# Patient Record
Sex: Female | Born: 1975 | Race: White | Hispanic: No | Marital: Married | Smoking: Never smoker
Health system: Southern US, Community
[De-identification: ages and names within clinical notes are randomized; demographics above are authoritative.]

## PROBLEM LIST (undated history)

## (undated) DIAGNOSIS — M25473 Effusion, unspecified ankle: Secondary | ICD-10-CM

## (undated) DIAGNOSIS — IMO0001 Reserved for inherently not codable concepts without codable children: Secondary | ICD-10-CM

## (undated) DIAGNOSIS — I10 Essential (primary) hypertension: Secondary | ICD-10-CM

## (undated) DIAGNOSIS — R609 Edema, unspecified: Secondary | ICD-10-CM

## (undated) DIAGNOSIS — M069 Rheumatoid arthritis, unspecified: Secondary | ICD-10-CM

## (undated) HISTORY — PX: MANDIBLE FRACTURE SURGERY: SHX706

## (undated) HISTORY — DX: Reserved for inherently not codable concepts without codable children: IMO0001

## (undated) HISTORY — DX: Edema, unspecified: R60.9

## (undated) HISTORY — DX: Effusion, unspecified ankle: M25.473

## (undated) HISTORY — DX: Rheumatoid arthritis, unspecified: M06.9

## (undated) HISTORY — DX: Essential (primary) hypertension: I10

---

## 1999-06-30 ENCOUNTER — Other Ambulatory Visit: Admission: RE | Admit: 1999-06-30 | Discharge: 1999-06-30 | Payer: Self-pay | Admitting: Gynecology

## 2000-06-29 ENCOUNTER — Other Ambulatory Visit: Admission: RE | Admit: 2000-06-29 | Discharge: 2000-06-29 | Payer: Self-pay | Admitting: Gynecology

## 2001-07-12 ENCOUNTER — Other Ambulatory Visit: Admission: RE | Admit: 2001-07-12 | Discharge: 2001-07-12 | Payer: Self-pay | Admitting: Gynecology

## 2005-01-20 ENCOUNTER — Ambulatory Visit: Payer: Self-pay | Admitting: Family Medicine

## 2005-02-17 ENCOUNTER — Ambulatory Visit: Payer: Self-pay | Admitting: Family Medicine

## 2006-02-08 ENCOUNTER — Ambulatory Visit: Payer: Self-pay | Admitting: Family Medicine

## 2006-07-28 ENCOUNTER — Ambulatory Visit: Payer: Self-pay | Admitting: Family Medicine

## 2006-07-28 DIAGNOSIS — I10 Essential (primary) hypertension: Secondary | ICD-10-CM | POA: Insufficient documentation

## 2006-07-29 ENCOUNTER — Encounter: Payer: Self-pay | Admitting: Family Medicine

## 2006-07-29 LAB — CONVERTED CEMR LAB
ALT: 12 units/L (ref 0–35)
AST: 13 units/L (ref 0–37)
Albumin: 4.4 g/dL (ref 3.5–5.2)
Alkaline Phosphatase: 77 units/L (ref 39–117)
BUN: 13 mg/dL (ref 6–23)
Chloride: 106 meq/L (ref 96–112)
Creatinine, Ser: 0.78 mg/dL (ref 0.40–1.20)
HDL: 57 mg/dL (ref >39)
LDL Cholesterol: 47 mg/dL (ref 0–99)
Potassium: 4.2 meq/L (ref 3.5–5.3)
TSH: 2.523 microintl units/mL (ref 0.350–5.50)
Total CHOL/HDL Ratio: 2.2

## 2006-07-30 ENCOUNTER — Encounter: Payer: Self-pay | Admitting: Family Medicine

## 2006-09-24 ENCOUNTER — Ambulatory Visit: Payer: Self-pay | Admitting: Family Medicine

## 2006-11-05 ENCOUNTER — Encounter: Payer: Self-pay | Admitting: Family Medicine

## 2006-11-18 ENCOUNTER — Ambulatory Visit: Payer: Self-pay | Admitting: Family Medicine

## 2007-02-22 ENCOUNTER — Encounter: Payer: Self-pay | Admitting: Family Medicine

## 2007-03-09 ENCOUNTER — Encounter: Payer: Self-pay | Admitting: Family Medicine

## 2007-03-09 DIAGNOSIS — IMO0001 Reserved for inherently not codable concepts without codable children: Secondary | ICD-10-CM

## 2007-03-09 HISTORY — DX: Reserved for inherently not codable concepts without codable children: IMO0001

## 2007-03-15 ENCOUNTER — Ambulatory Visit: Payer: Self-pay | Admitting: Family Medicine

## 2007-08-08 ENCOUNTER — Encounter: Payer: Self-pay | Admitting: Family Medicine

## 2007-09-19 ENCOUNTER — Encounter: Payer: Self-pay | Admitting: Family Medicine

## 2007-11-01 ENCOUNTER — Ambulatory Visit: Payer: Self-pay | Admitting: Family Medicine

## 2008-03-28 ENCOUNTER — Encounter: Payer: Self-pay | Admitting: Family Medicine

## 2008-07-05 ENCOUNTER — Ambulatory Visit: Payer: Self-pay | Admitting: Family Medicine

## 2008-07-05 DIAGNOSIS — M25569 Pain in unspecified knee: Secondary | ICD-10-CM

## 2008-07-06 ENCOUNTER — Encounter: Admission: RE | Admit: 2008-07-06 | Discharge: 2008-07-06 | Payer: Self-pay | Admitting: Family Medicine

## 2008-08-06 ENCOUNTER — Ambulatory Visit: Payer: Self-pay | Admitting: Family Medicine

## 2008-09-20 ENCOUNTER — Encounter: Payer: Self-pay | Admitting: Family Medicine

## 2009-01-08 ENCOUNTER — Ambulatory Visit: Payer: Self-pay | Admitting: Family Medicine

## 2009-01-08 ENCOUNTER — Encounter: Admission: RE | Admit: 2009-01-08 | Discharge: 2009-01-08 | Payer: Self-pay | Admitting: Family Medicine

## 2009-01-08 DIAGNOSIS — M2550 Pain in unspecified joint: Secondary | ICD-10-CM

## 2009-01-09 ENCOUNTER — Encounter: Payer: Self-pay | Admitting: Family Medicine

## 2009-01-09 LAB — CONVERTED CEMR LAB
ALT: 15 units/L (ref 0–35)
AST: 17 units/L (ref 0–37)
Alkaline Phosphatase: 69 units/L (ref 39–117)
Basophils Absolute: 0.1 10*3/uL (ref 0.0–0.1)
CRP: 0 mg/dL (ref <0.6)
Glucose, Bld: 83 mg/dL (ref 70–99)
Lymphocytes Relative: 25 % (ref 12–46)
Lymphs Abs: 2.9 10*3/uL (ref 0.7–4.0)
Neutro Abs: 8 10*3/uL — ABNORMAL HIGH (ref 1.7–7.7)
Platelets: 338 10*3/uL (ref 150–400)
RDW: 13.1 % (ref 11.5–15.5)
Sed Rate: 1 mm/hr (ref 0–22)
Sodium: 140 meq/L (ref 135–145)
Total Bilirubin: 0.5 mg/dL (ref 0.3–1.2)
Total Protein: 8.1 g/dL (ref 6.0–8.3)
WBC: 11.8 10*3/uL — ABNORMAL HIGH (ref 4.0–10.5)

## 2009-02-04 ENCOUNTER — Encounter: Payer: Self-pay | Admitting: Family Medicine

## 2009-03-01 ENCOUNTER — Telehealth (INDEPENDENT_AMBULATORY_CARE_PROVIDER_SITE_OTHER): Payer: Self-pay | Admitting: *Deleted

## 2009-03-01 ENCOUNTER — Ambulatory Visit: Payer: Self-pay | Admitting: Family Medicine

## 2009-03-01 DIAGNOSIS — J069 Acute upper respiratory infection, unspecified: Secondary | ICD-10-CM | POA: Insufficient documentation

## 2009-03-01 LAB — CONVERTED CEMR LAB
Inflenza A Ag: NEGATIVE
Influenza B Ag: NEGATIVE
Rapid Strep: NEGATIVE

## 2009-03-21 ENCOUNTER — Encounter: Payer: Self-pay | Admitting: Family Medicine

## 2009-07-01 ENCOUNTER — Ambulatory Visit: Payer: Self-pay | Admitting: Family Medicine

## 2009-07-01 DIAGNOSIS — R599 Enlarged lymph nodes, unspecified: Secondary | ICD-10-CM | POA: Insufficient documentation

## 2009-07-02 LAB — CONVERTED CEMR LAB
Basophils Absolute: 0.1 10*3/uL (ref 0.0–0.1)
Basophils Relative: 1 % (ref 0–1)
MCHC: 33 g/dL (ref 30.0–36.0)
Monocytes Relative: 6 % (ref 3–12)
Neutro Abs: 6.3 10*3/uL (ref 1.7–7.7)
Neutrophils Relative %: 67 % (ref 43–77)
RBC: 4.54 M/uL (ref 3.87–5.11)

## 2009-07-15 ENCOUNTER — Ambulatory Visit: Payer: Self-pay | Admitting: Family Medicine

## 2009-09-24 ENCOUNTER — Encounter: Payer: Self-pay | Admitting: Family Medicine

## 2010-02-18 NOTE — Progress Notes (Signed)
Summary: wants ov today  Phone Note Call from Patient Call back at Home Phone [phone]   Caller: Patient----live call Summary of Call: Wants ov today. Has a sore throat and thinks that it is strep. Please advise pt. Initial call taken by: Warnell Forester,  March 01, 2009 9:16 AM  Follow-up for Phone Call        Apt scheduled Follow-up by: Payton Spark CMA,  March 01, 2009 9:31 AM

## 2010-02-18 NOTE — Letter (Signed)
Summary: [facility]  [facility]   Imported By: Lanelle Bal 10/10/2009 11:01:31  _____________________________________________________________________  External Attachment:    Type:   Image     Comment:   External Document

## 2010-02-18 NOTE — Assessment & Plan Note (Signed)
Summary: recheck LN   Vital Signs:  Patient profile:   35 year old female Height:      66 inches Weight:      114 pounds BMI:     18.47 O2 Sat:      100 % on Room air Pulse rate:   73 / minute BP sitting:   119 / 84  (left arm) Cuff size:   regular  Vitals Entered By: Payton Spark CMA (July 15, 2009 1:07 PM)  O2 Flow:  Room air CC: F/U lump under R arm.    Primary Care Provider:  Nani Gasser MD  CC:  F/U lump under R arm. [name]  History of Present Illness: 35 yo WF presents for a painful lump under her R arm.  She has seen 2 wks ago and took 10 days of Clindamycin.  Her CBC came back normal.  The lump has reduced in size and is no longer painful.  Denies any fevers, drainage or redness.  No other complaints.  Current Medications (verified): 1)  Maxalt 10 Mg Tabs (Rizatriptan Benzoate) .... Take One Tablet By Mouth As Needed For Migraine 2)  Topamax 50 Mg Tabs (Topiramate) .[name].. 1 By Mouth Bid 3)  Methotrexate 2.5 Mg Tabs (Methotrexate Sodium) .... Take 5 Tabs Weekly  Allergies (verified): No Known Drug Allergies  Past History:  Past Medical History: Reviewed history from 03/15/2007 and no changes required. 03-09-07 IUD place.   Social History: Reviewed history from 10/27/2005 and no changes required. 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  Married to [employer] and has one daughter.  Never smoked, 1 EtOH per week, no druts, 1 caffeinated drink per week.  Walks 2x/wk  Review of Systems      See HPI  Physical Exam  General:  alert, well-developed, well-nourished, and well-hydrated.   Head:  normocephalic and atraumatic.   Mouth:  pharynx pink and moist.   Neck:  no masses.   Lungs:  Normal respiratory effort, chest expands symmetrically. Lungs are clear to auscultation, no crackles or wheezes. Heart:  Normal rate and regular rhythm. S1 and S2 normal without gallop, murmur, click, rub or other extra sounds. Skin:  color normal and no suspicious  lesions.   Axillary Nodes:  slightly shotty nontender R axillary node w/o any overlying skin changes.  freely moveable   Impression & Recommendations:  Problem # 1:  LYMPHADENOPATHY (ICD-785.6) R axillary Lymphadenitis vs folliculitis.  REsolved after 10 days of Clindamycin. Call if any problems. The following medications were removed from the medication list:    Clindamycin Hcl 300 Mg Caps (Clindamycin hcl) .[name]... 1 tab by mouth three times a day x 10 days  Complete Medication List: 1)  Maxalt 10 Mg Tabs (Rizatriptan benzoate) .... Take one tablet by mouth as needed for migraine 2)  Topamax 50 Mg Tabs (Topiramate) .[name].. 1 by mouth bid 3)  Methotrexate 2.5 Mg Tabs (Methotrexate sodium) .... Take 5 tabs weekly  Patient Instructions: 1)  Folliculitis : much improved. 2)  Use Dial soap in the shower daily. 3)  Change razor every 1 wk. 4)  Call if any further problems.

## 2010-02-18 NOTE — Assessment & Plan Note (Signed)
Summary: URI   Vital Signs:  Patient profile:   35 year old female Height:      66 inches Weight:      112 pounds BMI:     18.14 O2 Sat:      100 % on Room air Temp:     98.8 degrees F oral Pulse rate:   101 / minute BP sitting:   133 / 96  (right arm) Cuff size:   regular  Vitals Entered By: Payton Spark CMA/April (March 01, 2009 3:29 PM)  O2 Flow:  Room air CC: c/o of sore throat, cough and chills x 2 days   Primary Care Provider:  Linford Arnold, C  CC:  c/o of sore throat and cough and chills x 2 days.  History of Present Illness:  35 yo WF presents for 2 days of sore thoat, chills, cough.  no runny nose.  No fevers.  Did not get a flu shot.  She had a HA today.  No bodyaches.  Feels tired.  She is not taking anything for her symptoms.  No  missed work.   She is a Runner, broadcasting/film/video.  Sleeping fine at night.      Current Medications (verified): 1)  Maxalt 10 Mg Tabs (Rizatriptan Benzoate) .... Take One Tablet By Mouth As Needed For Migraine 2)  Topamax 50 Mg Tabs (Topiramate) .[name].. 1 By Mouth Bid  Allergies (verified): No Known Drug Allergies  Past History:  Past Medical History: Reviewed history from 03/15/2007 and no changes required. 03-09-07 IUD place.   Review of Systems      See HPI  Physical Exam  General:  alert, well-developed, well-nourished, and well-hydrated.   Head:  normocephalic and atraumatic.  sinuses NTTP Eyes:  conjunctiva clear Ears:  EACs patent; TMs translucent and gray with good cone of light and bony landmarks.  Nose:  mild clear rhinorrhea Mouth:  throat mildly injected.   Neck:  no masses.   Lungs:  Normal respiratory effort, chest expands symmetrically. Lungs are clear to auscultation, no crackles or wheezes. Heart:  Normal rate and regular rhythm. S1 and S2 normal without gallop, murmur, click, rub or other extra sounds. Abdomen:  soft, non-tender, normal bowel sounds, and no distention.   Skin:  color normal.   Cervical Nodes:  shotty  anterior cervical chain LA   Impression & Recommendations:  Problem # 1:  VIRAL URI (ICD-465.9)  Rapid strep and rapid flu neg.   Supportive care for viral URI. Call if not improved in 7 days.    Orders: Flu A+B (87400) Rapid Strep (95621)  Problem # 2:  HYPERTENSION, BENIGN ESSENTIAL (ICD-401.1) BP high today.  Limit use of systemic decongestants to 4-5 days of worst nasal congestion. Will need f/u with Dr Linford Arnold if remaining high.   BP today: 133/96 Prior BP: 131/90 (01/08/2009)  Prior 10 Yr Risk Heart Disease: 1 % (09/24/2006)  Labs Reviewed: K+: 4.3 (01/09/2009) Creat: : 0.83 (01/09/2009)   Chol: 127 (07/29/2006)   HDL: 57 (07/29/2006)   LDL: 47 (07/29/2006)   TG: 116 (07/29/2006)  Complete Medication List: 1)  Maxalt 10 Mg Tabs (Rizatriptan benzoate) .... Take one tablet by mouth as needed for migraine 2)  Topamax 50 Mg Tabs (Topiramate) .[name].. 1 by mouth bid  Patient Instructions: 1)  Rapid strep neg. 2)  Rapid flu neg. 3)  Treat for cold virus with OTC cold meds. 4)  Advil Cold and Flu is a good one. 5)  Plenty of clear fluids, rest.  6)  Call if not improved in 7 days.  Laboratory Results    Other Tests  Rapid Strep: negative Influenza A: negative Influenza B: negative

## 2010-02-18 NOTE — Consult Note (Signed)
Summary: [facility]  [facility]   Imported By: Lanelle Bal 02/21/2009 10:47:52  _____________________________________________________________________  External Attachment:    Type:   Image     Comment:   External Document

## 2010-02-18 NOTE — Assessment & Plan Note (Signed)
Summary: Lump near  Rt. arm- pit- jr   Vital Signs:  Patient profile:   35 year old female Height:      66 inches Weight:      114 pounds BMI:     18.47 O2 Sat:      100 % on Room air Temp:     98.5 degrees F oral Pulse rate:   93 / minute BP sitting:   139 / 94  (left arm) Cuff size:   regular  Vitals Entered By: Payton Spark CMA (July 01, 2009 3:15 PM)  O2 Flow:  Room air CC: Lump under R arm x 1 month   Primary Care Provider:  Linford Arnold, C  CC:  Lump under R arm x 1 month.  History of Present Illness: 35 yo woman here today for lump under R arm.  initially not painful but currently is.  first appeared 'a few months ago'.  no fevers.  pt has RA, on methotrexate.  no drainage from the area.  no redness or warmth.  area is now burning rather than pain.  now some discomfort under L arm.  Current Medications (verified): 1)  Maxalt 10 Mg Tabs (Rizatriptan Benzoate) .... Take One Tablet By Mouth As Needed For Migraine 2)  Topamax 50 Mg Tabs (Topiramate) .[name].. 1 By Mouth Bid 3)  Methotrexate 2.5 Mg Tabs (Methotrexate Sodium) .... Take 5 Tabs Weekly  Allergies (verified): No Known Drug Allergies  Review of Systems      See HPI  Physical Exam  General:  alert, well-developed, well-nourished, and well-hydrated.   Neck:  no masses/LAD Cervical Nodes:  No lymphadenopathy noted Axillary Nodes:  3 cm x 2.5 cm firm, TTP swelling on R small less discrete, mobile swelling on L Inguinal Nodes:  No significant adenopathy   Impression & Recommendations:  Problem # 1:  LYMPHADENOPATHY (ICD-785.6) Assessment New differential includes lymphadenitis, catscratch dz, malignancy.  given pt's immunosuppression all are possible.  check CBC to assess for infxn or other abnormality.  start abx- choices are limited due to concurrent use of methotrexate.  if no improvement after round of abx may need bx to further assess.  discussed this w/ pt and she is in agreement w/ plan. Her updated  medication list for this problem includes:    Clindamycin Hcl 300 Mg Caps (Clindamycin hcl) .[name]... 1 tab by mouth three times a day x 10 days  Orders: Venipuncture (78469) TLB-CBC Platelet - w/Differential (85025-CBCD)  Complete Medication List: 1)  Maxalt 10 Mg Tabs (Rizatriptan benzoate) .... Take one tablet by mouth as needed for migraine 2)  Topamax 50 Mg Tabs (Topiramate) .[name].. 1 by mouth bid 3)  Methotrexate 2.5 Mg Tabs (Methotrexate sodium) .... Take 5 tabs weekly 4)  Clindamycin Hcl 300 Mg Caps (Clindamycin hcl) .[name].. 1 tab by mouth three times a day x 10 days  Patient Instructions: 1)  Please schedule a follow-up appointment in 2 weeks to recheck lymph node. 2)  Take the Clindamycin as directed- take w/ food to avoid upset stomach 3)  Apply hot compresses to the area 4)  If you develop worsening pain, increased number of lymph nodes or increased size- please call 5)  Hang in there!  Prescriptions: CLINDAMYCIN HCL 300 MG CAPS (CLINDAMYCIN HCL) 1 tab by mouth three times a day x 10 days  #30 x 0   Entered and Authorized by:   Neena Rhymes MD   Signed by:   Neena Rhymes MD on 07/01/2009  Method used:   Electronically to        [id number] (retail)       [address]       [place], [place]  [address]       Ph: [phone] or [phone]       Fax: [phone]   RxID:   [id number]

## 2010-02-18 NOTE — Letter (Signed)
Summary: [facility]  [facility]   Imported By: Lanelle Bal 03/29/2009 11:27:45  _____________________________________________________________________  External Attachment:    Type:   Image     Comment:   External Document

## 2010-07-24 ENCOUNTER — Encounter: Payer: Self-pay | Admitting: Family Medicine

## 2010-07-25 ENCOUNTER — Ambulatory Visit (INDEPENDENT_AMBULATORY_CARE_PROVIDER_SITE_OTHER): Payer: BC Managed Care – PPO | Admitting: Family Medicine

## 2010-07-25 ENCOUNTER — Ambulatory Visit
Admission: RE | Admit: 2010-07-25 | Discharge: 2010-07-25 | Disposition: A | Discharge status: Home / Self Care | Payer: BC Managed Care – PPO | Source: Ambulatory Visit | Attending: Family Medicine | Admitting: Family Medicine

## 2010-07-25 ENCOUNTER — Other Ambulatory Visit: Payer: Self-pay | Admitting: Family Medicine

## 2010-07-25 ENCOUNTER — Telehealth: Payer: Self-pay | Admitting: Family Medicine

## 2010-07-25 ENCOUNTER — Encounter: Payer: Self-pay | Admitting: Family Medicine

## 2010-07-25 VITALS — BP 144/95 | HR 90 | Resp 20 | Ht 66.0 in | Wt 135.0 lb

## 2010-07-25 DIAGNOSIS — M25579 Pain in unspecified ankle and joints of unspecified foot: Secondary | ICD-10-CM

## 2010-07-25 DIAGNOSIS — R52 Pain, unspecified: Secondary | ICD-10-CM

## 2010-07-25 DIAGNOSIS — M25572 Pain in left ankle and joints of left foot: Secondary | ICD-10-CM

## 2010-07-25 MED ORDER — METHYLDOPA 250 MG PO TABS: 250.0000 mg | ORAL_TABLET | Freq: Two times a day (BID) | ORAL | Status: DC

## 2010-07-25 NOTE — Patient Instructions (Signed)
ACE wrap for compression.  We will call you with the xray results.

## 2010-07-25 NOTE — Progress Notes (Signed)
  Subjective:    Patient ID: Felicia James, female    DOB: 04-Oct-1975, 35 y.o.   MRN: [id number]  HPI Lt Ankle swelling started about 3 weeks ago.  Hurst when up on it a long time, o/w no sig pain.  Swelling is worse by the end of the day. No rtauma. Looks better in the AM. Props it up at night. No pain relievers.  No other swelling. Never had before. Trying ot get pregnant.   Review of Systems     Objective:   Physical Exam     Left ankle with 1+ pitting edema laterally. NROM and laxity of the joint. Strength 5/5.    Assessment & Plan:  Ankle swelling - Will get xray to rule ot fracture or foreign body.  Recommend ACE wrap.  Also will start BP med and see if swelling improves. She cannot take a diuretic since she is trying to get pregnant.  F/U 1 wk.

## 2010-07-25 NOTE — Telephone Encounter (Signed)
Call pt: Normal xray of the ankle.  Try the ACE wrap and start BP med and f/u in one week.

## 2010-07-28 NOTE — Telephone Encounter (Signed)
Advised pt of results and rec. 

## 2010-08-15 ENCOUNTER — Encounter: Payer: Self-pay | Admitting: Family Medicine

## 2010-08-15 ENCOUNTER — Ambulatory Visit (INDEPENDENT_AMBULATORY_CARE_PROVIDER_SITE_OTHER): Payer: BC Managed Care – PPO | Admitting: Family Medicine

## 2010-08-15 VITALS — BP 112/79 | HR 85 | Ht 66.0 in | Wt 135.0 lb

## 2010-08-15 DIAGNOSIS — I10 Essential (primary) hypertension: Secondary | ICD-10-CM

## 2010-08-15 DIAGNOSIS — R609 Edema, unspecified: Secondary | ICD-10-CM

## 2010-08-15 MED ORDER — METHYLDOPA 250 MG PO TABS: 250.0000 mg | ORAL_TABLET | Freq: Two times a day (BID) | ORAL | Status: DC

## 2010-08-15 NOTE — Progress Notes (Signed)
  Subjective:    Patient ID: Felicia James, female    DOB: 1975/01/28, 35 y.o.   MRN: [id number]  HPI HTN-doing well on it.  No SE.  No dizziness or HA, No CP or SOB. Has been taking it regulalry.  At gyn office last week adn BP looks great!     Ankle swelling, LT - no pain still some mild sweling but overall better. Started swelling a little more this week. Overall improved.  Xrays were normal except for soft tissue swelling.     Review of Systems     Objective:   Physical Exam  Constitutional: She is oriented to person, place, and time. She appears well-developed and well-nourished.  HENT:  Head: Normocephalic and atraumatic.  Cardiovascular: Normal rate, regular rhythm and normal heart sounds.   Pulmonary/Chest: Effort normal and breath sounds normal.  Musculoskeletal:       LT ankle with 1+ pitting edema on the top of the foot and trace around the ankle.   Neurological: She is alert and oriented to person, place, and time.  Skin: Skin is warm and dry.  Psychiatric: She has a normal mood and affect.          Assessment & Plan:  Ankle swelling, LT - could be just venous stasis but she is young and not overweight and primarily once sided. No ankle trauma. Normal xray. Will refer to vascular for second opinion.  She is unable to take a diuretic bc trying to get pregnant.  No problem-specific assessment & plan notes found for this encounter.

## 2010-08-15 NOTE — Assessment & Plan Note (Signed)
At goal. Looks fantastic.  F/u in 6 months.

## 2010-09-11 ENCOUNTER — Encounter: Payer: Self-pay | Admitting: Vascular Surgery

## 2010-10-07 ENCOUNTER — Encounter: Payer: BC Managed Care – PPO | Admitting: Vascular Surgery

## 2010-10-15 ENCOUNTER — Encounter: Payer: Self-pay | Admitting: Vascular Surgery

## 2010-10-15 ENCOUNTER — Other Ambulatory Visit: Payer: Self-pay

## 2010-10-15 DIAGNOSIS — R609 Edema, unspecified: Secondary | ICD-10-CM

## 2010-10-16 ENCOUNTER — Ambulatory Visit (INDEPENDENT_AMBULATORY_CARE_PROVIDER_SITE_OTHER): Payer: BC Managed Care – PPO | Admitting: *Deleted

## 2010-10-16 ENCOUNTER — Encounter: Payer: Self-pay | Admitting: Vascular Surgery

## 2010-10-16 ENCOUNTER — Ambulatory Visit (INDEPENDENT_AMBULATORY_CARE_PROVIDER_SITE_OTHER): Payer: BC Managed Care – PPO | Admitting: Vascular Surgery

## 2010-10-16 VITALS — BP 122/85 | HR 90 | Resp 16 | Ht 66.0 in | Wt 135.0 lb

## 2010-10-16 DIAGNOSIS — R609 Edema, unspecified: Secondary | ICD-10-CM

## 2010-10-16 DIAGNOSIS — M25473 Effusion, unspecified ankle: Secondary | ICD-10-CM

## 2010-10-16 NOTE — Progress Notes (Signed)
[facility] HISTORY AND PHYSICAL   CC: Referring Physician:  History of Present Illness:  Patient is a 35 year old female who complains of left ankle swelling. The patient has had no previous trauma to left lower extremity. She notices that the swelling becomes worse after being on her feet all day long. This has been present for several months. She has not previously tried any compression. She has no family history of symptoms like this. She has been pregnant once in the past. The swelling is relieved with elevation of the leg. Her swelling usually resolves overnight after sleeping. However the left leg is never quite the same as the right. She does not really have pain but has some annoyance from the swelling   Past Medical History  Diagnosis Date  . IUD 03-09-07  . Hypertension   . Ankle swelling   . Edema   . Rheumatoid arthritis   . Migraine     Past Surgical History  Procedure Date  . Mandible fracture surgery     At age 59    ROS: [x]  Positive   [ ]  Negative   [ ]  All sytems reviewed and are negative  General:[ ]  Weight loss, [ ]  Fever, [ ]  chills Neurologic: [ ]  Dizziness, [ ]  Blackouts, [ ]  Seizure [ ]  Stroke, [ ]  "Mini stroke", [ ]  Slurred speech, [ ]  Temporary blindness;  [ ] weakness,  [ ]  Hoarseness Cardiac: [ ]  Chest pain/pressure, [ ]  Shortness of breath at rest [ ]  Shortness of breath with exertion,  [ ]   Atrial fibrillation or irregular heartbeat Vascular:[ ]  Pain in legs with walking, [ ]  Pain in legs at rest ,[ ]  Pain in legs at night,  [ ]   Non-healing ulcer, [ ]  Blood clot in vein/DVT,   Pulmonary: [ ]  Home oxygen, [ ]   Productive cough, [ ]  Coughing up blood,  [ ]  Asthma,  [ ]  Wheezing Musculoskeletal:  [x ] Arthritis, [ ]  Low back pain,  [ ]  Joint pain Hematologic:[ ]  Easy Bruising, [ ]  Anemia; [ ]  Hepatitis Gastrointestinal: [ ]  Blood in stool,  [ ]  Gastroesophageal Reflux, [ ]  Trouble swallowing Urinary: [ ]  chronic Kidney  disease, [ ]  on HD - [ ]  MWF or [ ]  TTHS, [ ]  Burning with urination, [ ]  Frequent urination, [ ]  Difficulty urinating;  Skin: [ ]  Rashes, [ ]  Wounds Psychological: [ ]  Anxiety,  [ ]  Depression  Social History History  Substance Use Topics  . Smoking status: Never Smoker   . Smokeless tobacco: Not on file  . Alcohol Use: 0.5 oz/week    1 drink(s) per week     per week    Family History Family History  Problem Relation Age of Onset  . Heart attack      grandfather  . Hyperlipidemia Mother   . Hypertension Father   . Diabetes Maternal Grandmother   . Stroke Maternal Grandmother   . Diabetes Maternal Grandfather   . Heart disease Paternal Grandfather     Allergies  No Known Allergies   Current Outpatient Prescriptions  Medication Sig Dispense Refill  . frovatriptan (FROVA) 2.5 MG tablet Take 2.5 mg by mouth as needed. If recurs, may repeat after 2 hours. Max of 3 tabs in 24 hours.       . methyldopa (ALDOMET) 250 MG tablet Take 1 tablet (250 mg total) by mouth 2 (two) times daily.  60 tablet  6  . methotrexate (RHEUMATREX)  2.5 MG tablet Take 5 tabs weekly       . rizatriptan (MAXALT) 10 MG tablet Take 10 mg by mouth as needed. May repeat in 2 hours if needed       . topiramate (TOPAMAX) 50 MG tablet Take 50 mg by mouth 2 (two) times daily.          Physical Examination  Filed Vitals:   10/16/10 1525  BP: 122/85  Pulse: 90  Resp: 16  Height: 5\' 6"  (1.676 m)  Weight: 135 lb (61.236 kg)    Body mass index is 21.79 kg/(m^2).  General:  Alert and oriented, no acute distress HEENT: Normal Neck: No bruit or JVD Pulmonary: Clear to auscultation bilaterally Cardiac: Regular Rate and Rhythm without murmur Gastrointestinal: Soft, non-tender, non-distended, no mass, no scars Skin: No rash Extremity Pulses:  2+ radial, brachial, femoral, dorsalis pedis, posterior tibial pulses bilaterally Musculoskeletal: No deformity, trace edema left pretibial and ankle region, no  obvious varicosities, no ulcers Neurologic: Upper and lower extremity motor 5/5 and symmetric  DATA:   Venous duplex shows no incompetence of her greater saphenous vein and no evidence of DVT she did not have significant deep vein incompetence either. 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   PLAN: The patient was placed in compression stockings today thigh high. Hopefully she will get some symptomatic relief from this. I did discuss with her the possibility of doing a venogram study of her left iliac vein for possibility of narrowing of this. I did explain to her that this would be an invasive procedure with some risk involved. Currently her symptoms are not very disabling and she wishes to defer this for now. She returns she wishes to have the study at some point future.

## 2010-10-21 NOTE — Procedures (Unsigned)
DUPLEX DEEP VENOUS EXAM - LOWER EXTREMITY  INDICATION:  Left lower extremity edema.  HISTORY:  Edema:  For 3 months. Trauma/Surgery:  No. Pain:  No. PE:  No. Previous DVT:  No. Anticoagulants:  No. Other:  DUPLEX EXAM:               CFV   SFV   PopV  PTV    GSV               R  L  R  L  R  L  R   L  R  L Thrombosis    o  o     o     o      o     o Spontaneous   +  +     +     +      +     + Phasic        +  +     +     +      +     + Augmentation  +  +     +     +      +     + Compressible  +  +     +     +      +     + Competent     +  0     +     +      +     +  Legend:  + - yes  o - no  p - partial  D - decreased  IMPRESSION:  No evidence of left lower extremity deep venous thrombosis.   _____________________________ Janetta Hora Fields, MD  EM/MEDQ  D:  10/16/2010  T:  10/16/2010  Job:  [id number]

## 2011-03-13 ENCOUNTER — Other Ambulatory Visit: Payer: Self-pay | Admitting: *Deleted

## 2011-03-13 MED ORDER — METHYLDOPA 250 MG PO TABS: 250.0000 mg | ORAL_TABLET | Freq: Two times a day (BID) | ORAL | Status: DC

## 2011-11-05 ENCOUNTER — Other Ambulatory Visit: Payer: Self-pay | Admitting: Family Medicine

## 2011-11-05 NOTE — Telephone Encounter (Signed)
Must make appointment before any further refills 

## 2011-11-21 IMAGING — CR DG HAND COMPLETE 3+V*R*
3 series · 3 of 3 positions shown · non-contrast
Comparison: None

CLINICAL DATA: Poly arthritis, redness and swelling of the right
index ring and little fingers.

RIGHT HAND - COMPLETE 3+ VIEW

[view not recorded (1 of 3)]
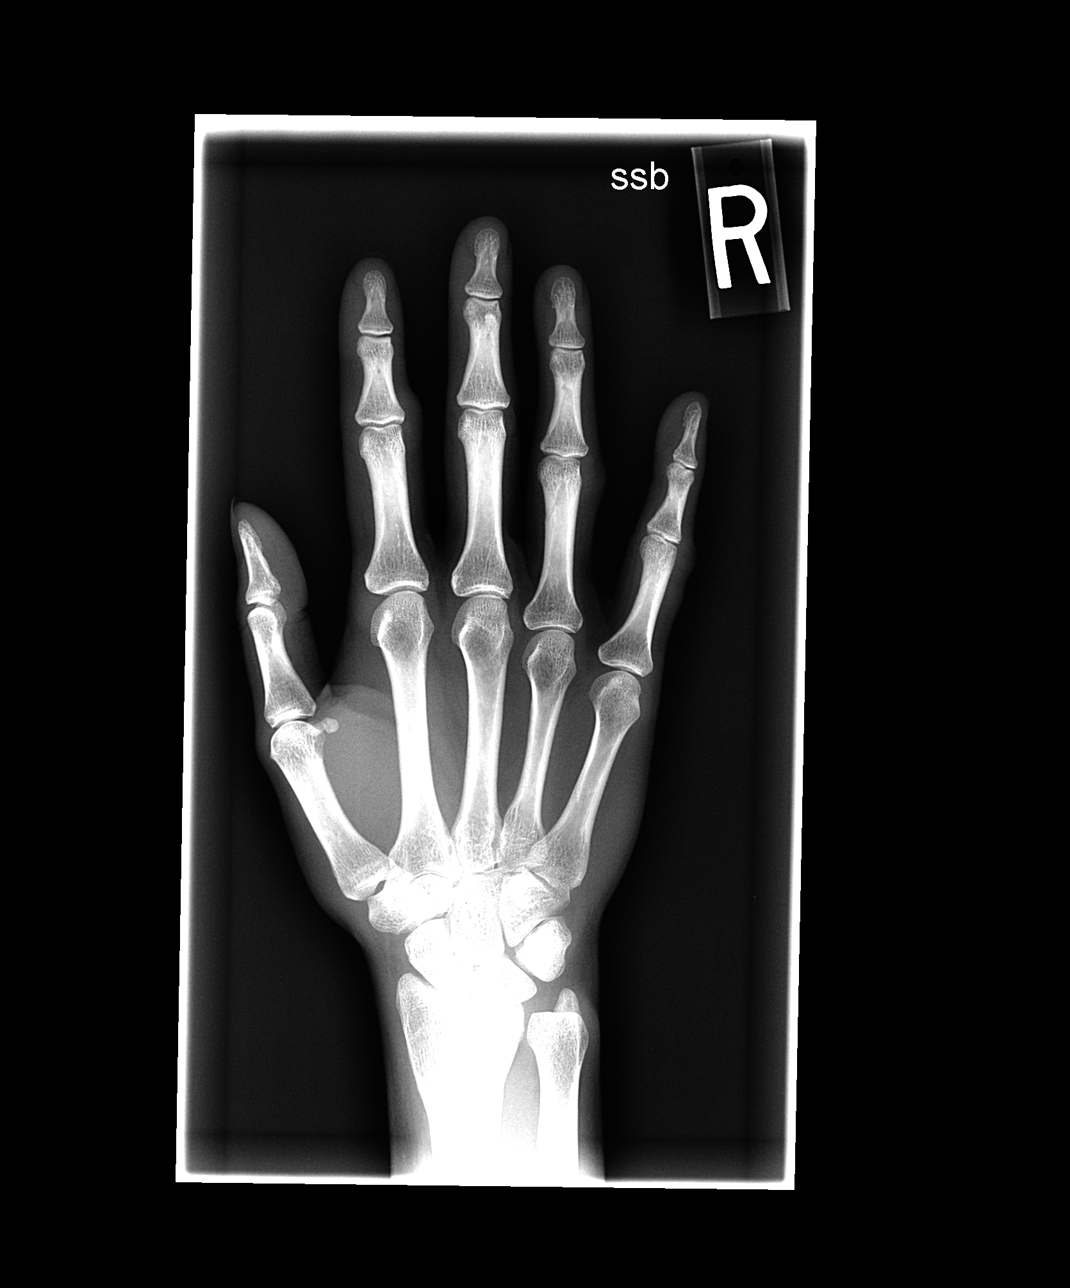

[view not recorded (2 of 3)]
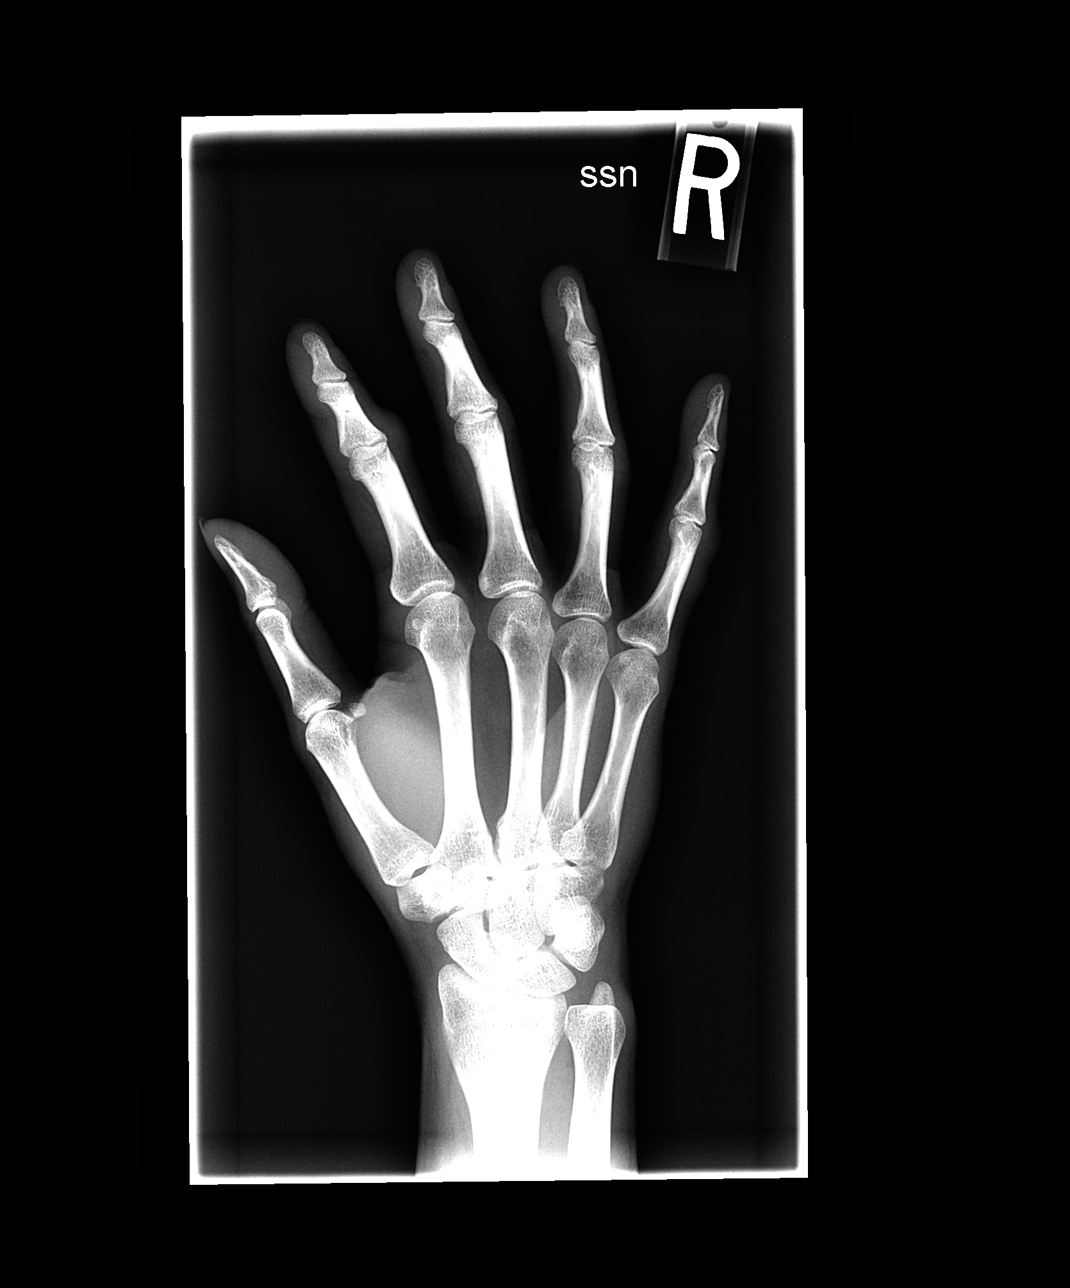

[view not recorded (3 of 3)]
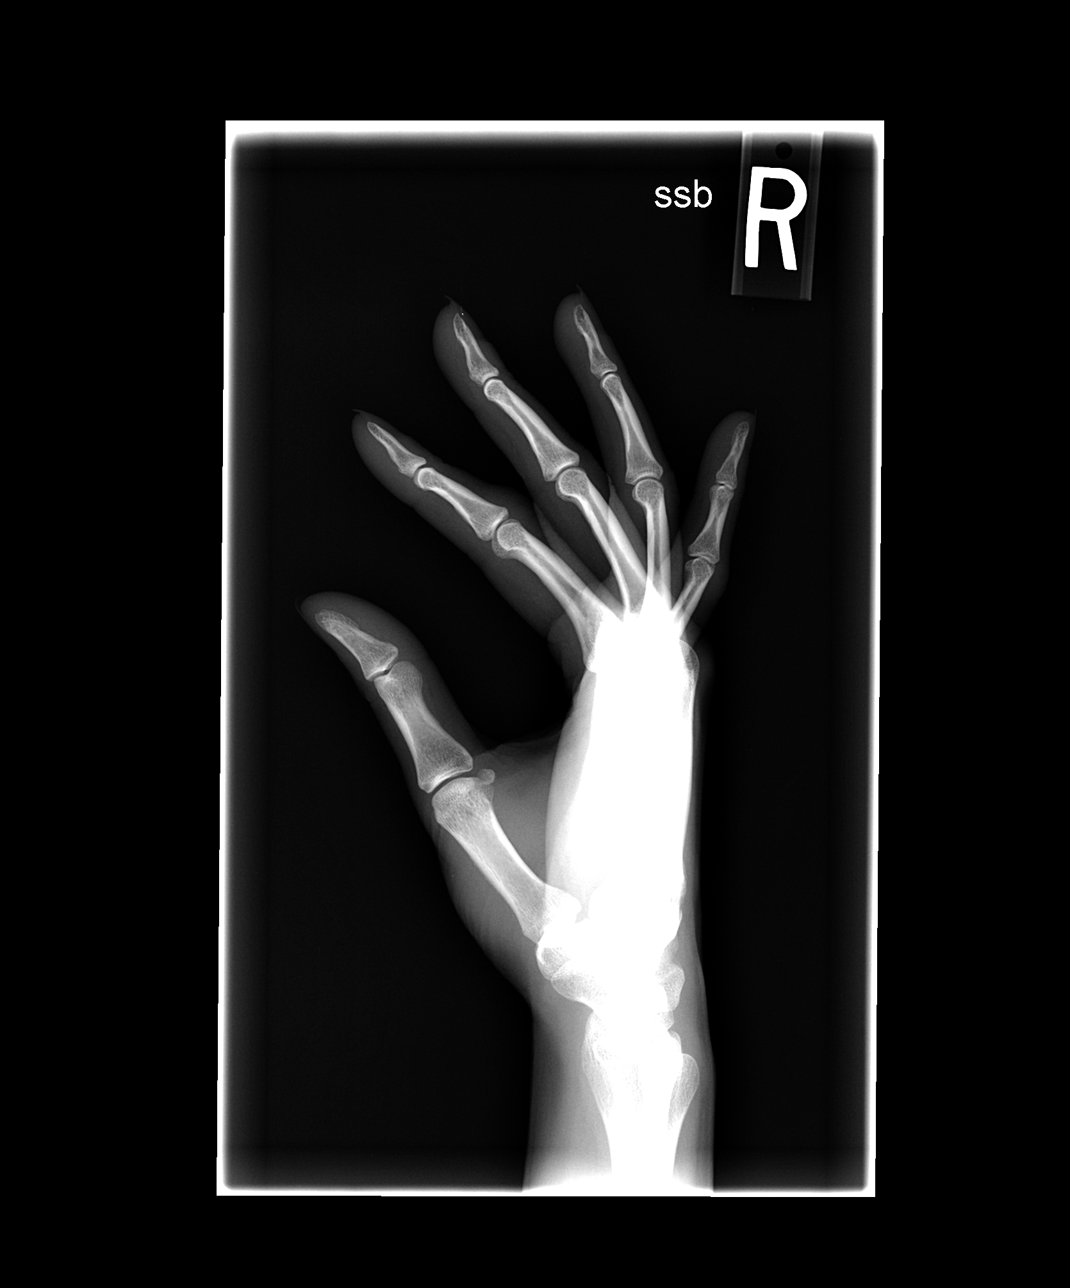

[3 of 3 positions shown; findings below may reference images not displayed]

FINDINGS: Bone mineralization normal.
Joint spaces preserved.
No acute fracture, dislocation or bone destruction.
No erosive changes identified.
Soft tissue swelling and nodularity noted at the dorsal ulnar
margins of the PIP joints of the index, ring and little fingers,
nonspecific appearance.
No soft tissue calcification.
IMPRESSION: No acute bony abnormalities.
Focal soft tissue nodularity at the PIP joints of the index, ring
and little fingers without definite underlying artery pathic
changes radiographically.
Differential diagnosis includes ganglion cysts, early gout, less
likely fibromas.
Finding nonspecific however and other arthropathies are not
completely excluded.
Recommend arthropathy workup.
If laboratory testing is nondiagnostic, can consider lesion
aspiration or less likely MR imaging of the dominant nodules in
either hand.

## 2011-12-04 ENCOUNTER — Other Ambulatory Visit: Payer: Self-pay | Admitting: Family Medicine

## 2011-12-04 NOTE — Telephone Encounter (Signed)
Must schedule appointment. No further refills

## 2011-12-04 NOTE — Telephone Encounter (Signed)
Needs appt. Can give her 2 weeks worth and tell her needs to come in next week on my schedule.

## 2011-12-04 NOTE — Telephone Encounter (Signed)
Is this ok. Last seen 09/2010

## 2012-02-02 ENCOUNTER — Ambulatory Visit (INDEPENDENT_AMBULATORY_CARE_PROVIDER_SITE_OTHER): Payer: BC Managed Care – PPO | Admitting: Family Medicine

## 2012-02-02 ENCOUNTER — Encounter: Payer: Self-pay | Admitting: Family Medicine

## 2012-02-02 VITALS — BP 145/95 | HR 80 | Wt 128.0 lb

## 2012-02-02 DIAGNOSIS — G43909 Migraine, unspecified, not intractable, without status migrainosus: Secondary | ICD-10-CM

## 2012-02-02 DIAGNOSIS — I10 Essential (primary) hypertension: Secondary | ICD-10-CM

## 2012-02-02 MED ORDER — METHYLDOPA 250 MG PO TABS: 250.0000 mg | ORAL_TABLET | Freq: Two times a day (BID) | ORAL | Status: DC

## 2012-02-02 NOTE — Progress Notes (Signed)
  Subjective:    Patient ID: Felicia James, female    DOB: 1975/11/21, 37 y.o.   MRN: [id number]  HPI HTN -  Pt denies chest pain, SOB, dizziness, or heart palpitations.  Taking meds as directed w/o problems.  Denies medication side effects.  No S.E on the medicaiton. Last seen for BP 1.5 years.  Says her migraines were better on the medication for her BP. She is now off all her migraine medications. She's been actively trying to get pregnant for the last 2 and half years. She's been undergoing fertility treatments. Unfortunately she has not been successful. Has been off meds x 3 weeks.  .    Review of Systems     Objective:   Physical Exam  Constitutional: She is oriented to person, place, and time. She appears well-developed and well-nourished.  HENT:  Head: Normocephalic and atraumatic.  Cardiovascular: Normal rate, regular rhythm and normal heart sounds.   Pulmonary/Chest: Effort normal and breath sounds normal.  Neurological: She is alert and oriented to person, place, and time.  Skin: Skin is warm and dry.  Psychiatric: She has a normal mood and affect. Her behavior is normal.          Assessment & Plan:  HTN- uncontrolled. She's been off her blood pressure medication for about 3 weeks. We discussed different options. For now because she's also continuing to try to get pregnant we will continue Aldomet. It is the safest option if she's actively trying to get pregnant. Followup in 6 months. She's due for CMP and fasting lipid panel. Lab slip given today. Call if any concerns or problems. Her headache should get better as well.  Migraines-she has been having more frequent headaches since off of her blood pressure medication. Thirdly this will improve if she restarts within the next couple weeks. If not then please let us know.

## 2012-02-08 LAB — COMPLETE METABOLIC PANEL WITH GFR
Albumin: 4.7 g/dL (ref 3.5–5.2)
BUN: 12 mg/dL (ref 6–23)
Calcium: 9.8 mg/dL (ref 8.4–10.5)
Chloride: 105 mEq/L (ref 96–112)
GFR, Est Non African American: >89 mL/min
Glucose, Bld: 88 mg/dL (ref 70–99)
Potassium: 3.9 mEq/L (ref 3.5–5.3)

## 2012-02-08 LAB — LIPID PANEL
Cholesterol: 158 mg/dL (ref 0–200)
Total CHOL/HDL Ratio: 2.3 Ratio

## 2012-02-08 LAB — TSH: TSH: 1.586 u[IU]/mL (ref 0.350–4.500)

## 2012-02-09 NOTE — Progress Notes (Signed)
Quick Note:  All labs are normal. ______ 

## 2012-08-18 ENCOUNTER — Encounter: Payer: Self-pay | Admitting: Family Medicine

## 2012-08-18 ENCOUNTER — Ambulatory Visit (INDEPENDENT_AMBULATORY_CARE_PROVIDER_SITE_OTHER): Payer: BC Managed Care – PPO | Admitting: Family Medicine

## 2012-08-18 VITALS — BP 139/88 | HR 82 | Ht 66.0 in | Wt 125.0 lb

## 2012-08-18 DIAGNOSIS — I10 Essential (primary) hypertension: Secondary | ICD-10-CM

## 2012-08-18 MED ORDER — METHYLDOPA 250 MG PO TABS: 250.0000 mg | ORAL_TABLET | Freq: Two times a day (BID) | ORAL | Status: DC

## 2012-08-18 NOTE — Progress Notes (Signed)
  Subjective:    Patient ID: Felicia James, female    DOB: 08-12-1975, 37 y.o.   MRN: [id number]  HPI HTN- Doing well on medication.  She took excedrin migraine this AM for a HA but otherwise doing well.  Pt denies chest pain, SOB, dizziness, or heart palpitations.  Taking meds as directed w/o problems.  Denies medication side effects.    Infertility - She has been trying for the last couple of years without success. W/u has been normal except problems with ovulation. Her gyn was going to refer her to an infertility specialist but she still been trying to pay off the workup for fertility since her insurance doesn't cover infertility.  Review of Systems     Objective:   Physical Exam  Constitutional: She is oriented to person, place, and time. She appears well-developed and well-nourished.  HENT:  Head: Normocephalic and atraumatic.  Cardiovascular: Normal rate, regular rhythm and normal heart sounds.   Pulmonary/Chest: Effort normal and breath sounds normal.  Neurological: She is alert and oriented to person, place, and time.  Skin: Skin is warm and dry.  Psychiatric: She has a normal mood and affect. Her behavior is normal.          Assessment & Plan:  HTN - WEll controlled.  Continue exercise and low salt diet. Followup in 6 months. Continue current regimen since she is not on any type of birth control or avoiding pregnancy.  Infertility-discussed could consider seeing a fertility specialist. She might even be a good candidate for IUI if she uses Clomid to stimulate ovulation. And it not as extensive as in vitro fertilization.

## 2013-03-28 ENCOUNTER — Ambulatory Visit (INDEPENDENT_AMBULATORY_CARE_PROVIDER_SITE_OTHER): Payer: BC Managed Care – PPO | Admitting: Family Medicine

## 2013-03-28 ENCOUNTER — Encounter: Payer: Self-pay | Admitting: Family Medicine

## 2013-03-28 VITALS — BP 130/81 | HR 80 | Temp 98.1°F | Ht 66.0 in | Wt 124.0 lb

## 2013-03-28 DIAGNOSIS — G43909 Migraine, unspecified, not intractable, without status migrainosus: Secondary | ICD-10-CM

## 2013-03-28 DIAGNOSIS — F430 Acute stress reaction: Secondary | ICD-10-CM

## 2013-03-28 DIAGNOSIS — I10 Essential (primary) hypertension: Secondary | ICD-10-CM

## 2013-03-28 MED ORDER — METOPROLOL SUCCINATE ER 25 MG PO TB24: 25.0000 mg | ORAL_TABLET | Freq: Every day | ORAL | Status: DC

## 2013-03-28 MED ORDER — HYDROCHLOROTHIAZIDE 25 MG PO TABS: 25.0000 mg | ORAL_TABLET | Freq: Every day | ORAL | Status: DC

## 2013-03-28 NOTE — Progress Notes (Signed)
Subjective:    Patient ID: Felicia James, female    DOB: Oct 02, 1975, 38 y.o.   MRN: [id number]  HPI Hypertension- Pt denies chest pain, SOB, dizziness, or heart palpitations.  Taking meds as directed w/o problems.  Denies medication side effects.    Migraine headaches-she's been having them more frequently. Her class this year has been very stressful. She's a Chartered loss adjusterschoolteacher. By the end of the week she just feels overwhelmed and gets a migraine almost every weekend. And then since the weekend recovering from the headache. She typically uses over-the-counter Excedrin Migraine. She's tried 3 different attempts in the past without any significant relief. She says it really doesn't work better than the over-the-counter medication. She just feels overwhelmed at times wants to know what she can do for this.   Review of Systems  BP 130/81  Pulse 80  Temp(Src) 98.1 F (36.7 C)  Ht 5\' 6"  (1.676 m)  Wt 124 lb (56.246 kg)  BMI 20.02 kg/m2  SpO2 99%    No Known Allergies  Past Medical History  Diagnosis Date  . IUD 03-09-07  . Hypertension   . Ankle swelling   . Edema   . Rheumatoid arthritis(714.0)   . Migraine     Past Surgical History  Procedure Laterality Date  . Mandible fracture surgery      At age 89    History   Social History  . Marital Status: Married    Spouse Name: N/A    Number of Children: 1  . Years of Education: N/A   Occupational History  . Not on file.   Social History Main Topics  . Smoking status: Never Smoker   . Smokeless tobacco: Not on file  . Alcohol Use: 0.5 oz/week    1 drink(s) per week     Comment: per week  . Drug Use: No  . 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   Other Topics Concern  . Not on file   Social History Narrative  . No narrative on file    Family History  Problem Relation Age of Onset  . Heart attack     grandfather  . Hyperlipidemia Mother   . Hypertension Father   . Diabetes Maternal Grandmother   . Stroke Maternal Grandmother   . Diabetes Maternal Grandfather   . Heart disease Paternal Grandfather     Outpatient Encounter Prescriptions as of 03/28/2013  Medication Sig  . [DISCONTINUED] methyldopa (ALDOMET) 250 MG tablet Take 1 tablet (250 mg total) by mouth 2 (two) times daily.  . metoprolol succinate (TOPROL-XL) 25 MG 24 hr tablet Take 1 tablet (25 mg total) by mouth daily.  . [DISCONTINUED] hydrochlorothiazide (HYDRODIURIL) 25 MG tablet Take 1 tablet (25 mg total) by mouth daily.          Objective:   Physical Exam  Constitutional: She is oriented to person, place, and time. She appears well-developed and well-nourished.  HENT:  Head: Normocephalic and atraumatic.  Cardiovascular: Normal rate, regular rhythm and normal heart sounds.   Pulmonary/Chest: Effort normal and breath sounds normal.  Neurological: She is alert and oriented to person, place, and time.  Skin: Skin is warm and dry.  Psychiatric: She has a normal mood and affect. Her behavior is normal.          Assessment & Plan:  Hypertension-well-controlled on current regimen. I  would like to get her off of the Aldomet since she is no longer trying to get pregnant. We'll switch to metoprolol because of heavy this will help with her migraine headaches as well. I would like for her to get a CMP and lipid panel next month. Followup in 6 months.  Migraine headaches-Kercher keep a diary. We will start a low-dose beta blocker and adjust as needed. She can certainly go 2 tabs if needed. Also discussed avoiding triggers. She says she feels like she does a pretty good job of that. Also working on reducing stress levels.  Acute situational stress-discussed possible therapies including counseling versus medication versus regular exercise. She's not interested in counseling at this time. She does do some exercise. She is  interested in possibly a medication. We discussed the use of SSRIs versus an occasional rescue medication such as a benzodiazepine. In the meantime we're going to stick with metoprolol which may help somewhat with the migraines and the blood pressure and see if this helps with her stress levels. School be over a couple months and she is hoping to make sure she wants to get a better class.

## 2013-04-18 ENCOUNTER — Other Ambulatory Visit: Payer: Self-pay | Admitting: Family Medicine

## 2013-06-06 IMAGING — CR DG ANKLE 2V *L*
2 series · 2 of 2 positions shown · non-contrast
Comparison: None.

CLINICAL DATA: Left ankle swelling for 3 weeks without specific
injury.

LEFT ANKLE - 2 VIEW

[view not recorded (1 of 2)]
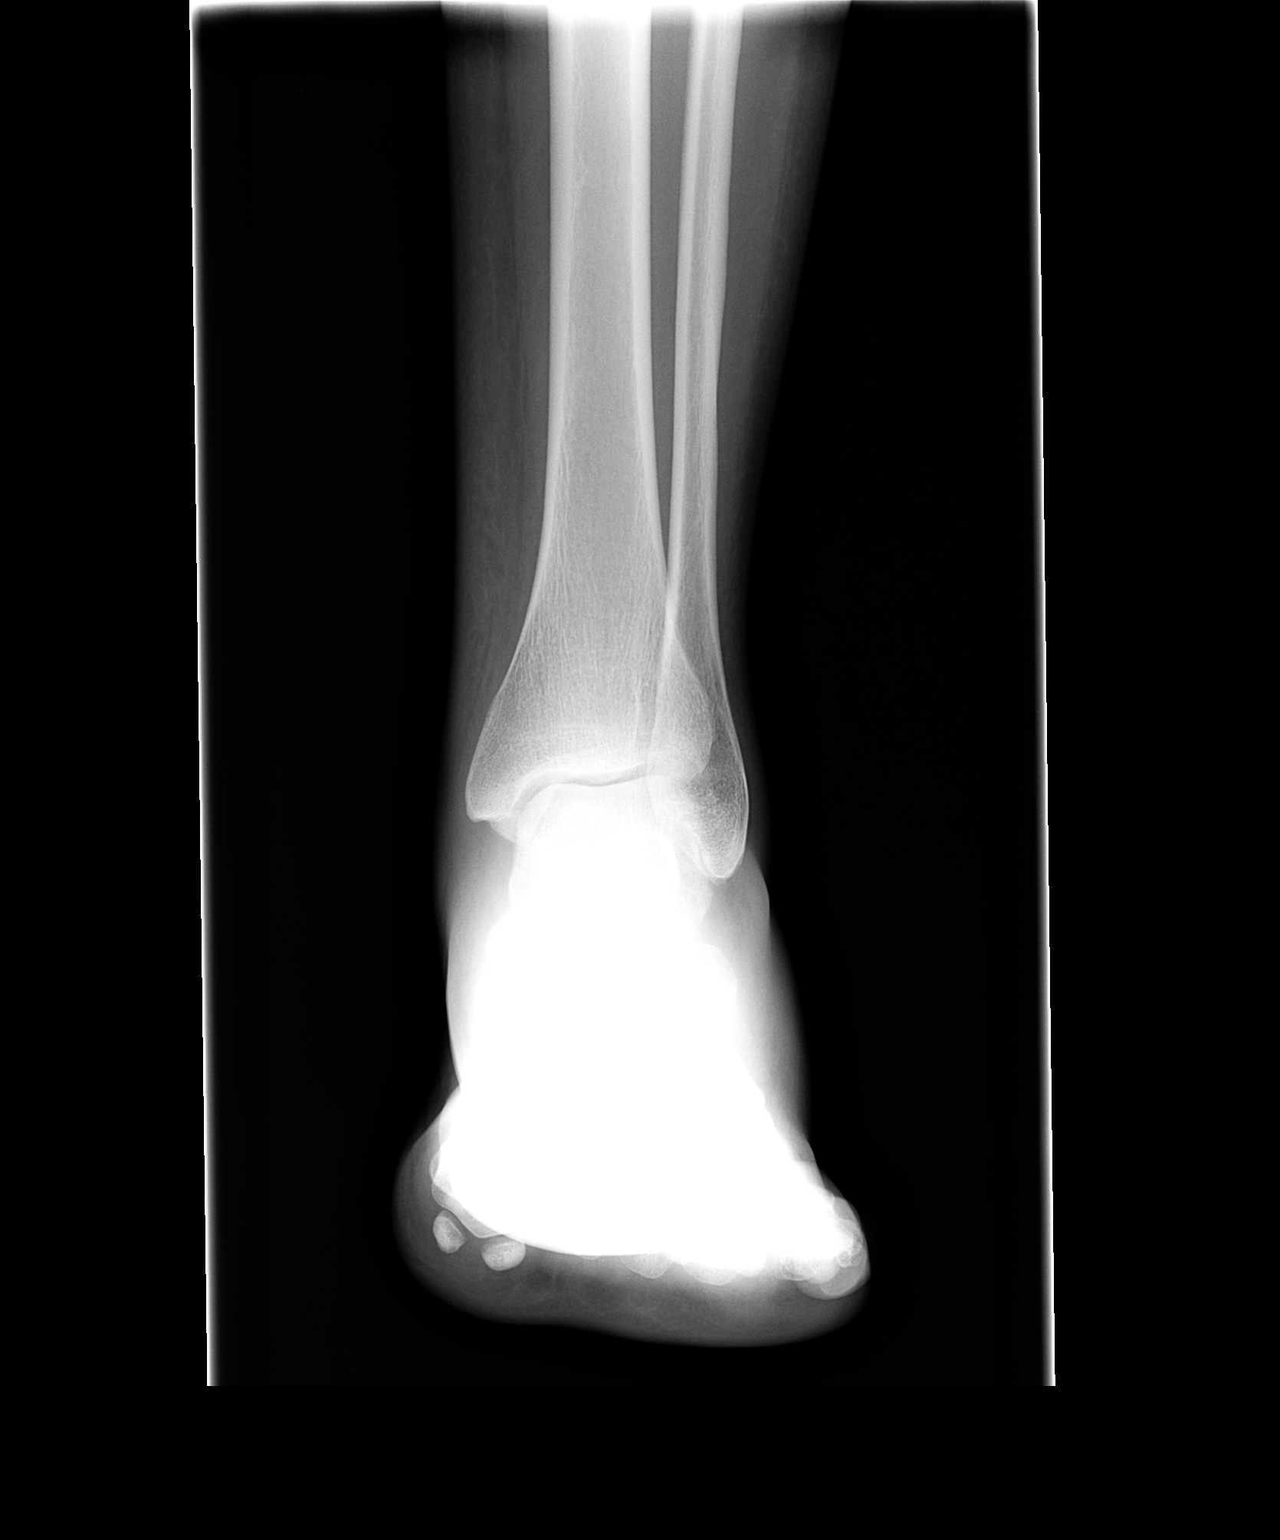

[view not recorded (2 of 2)]
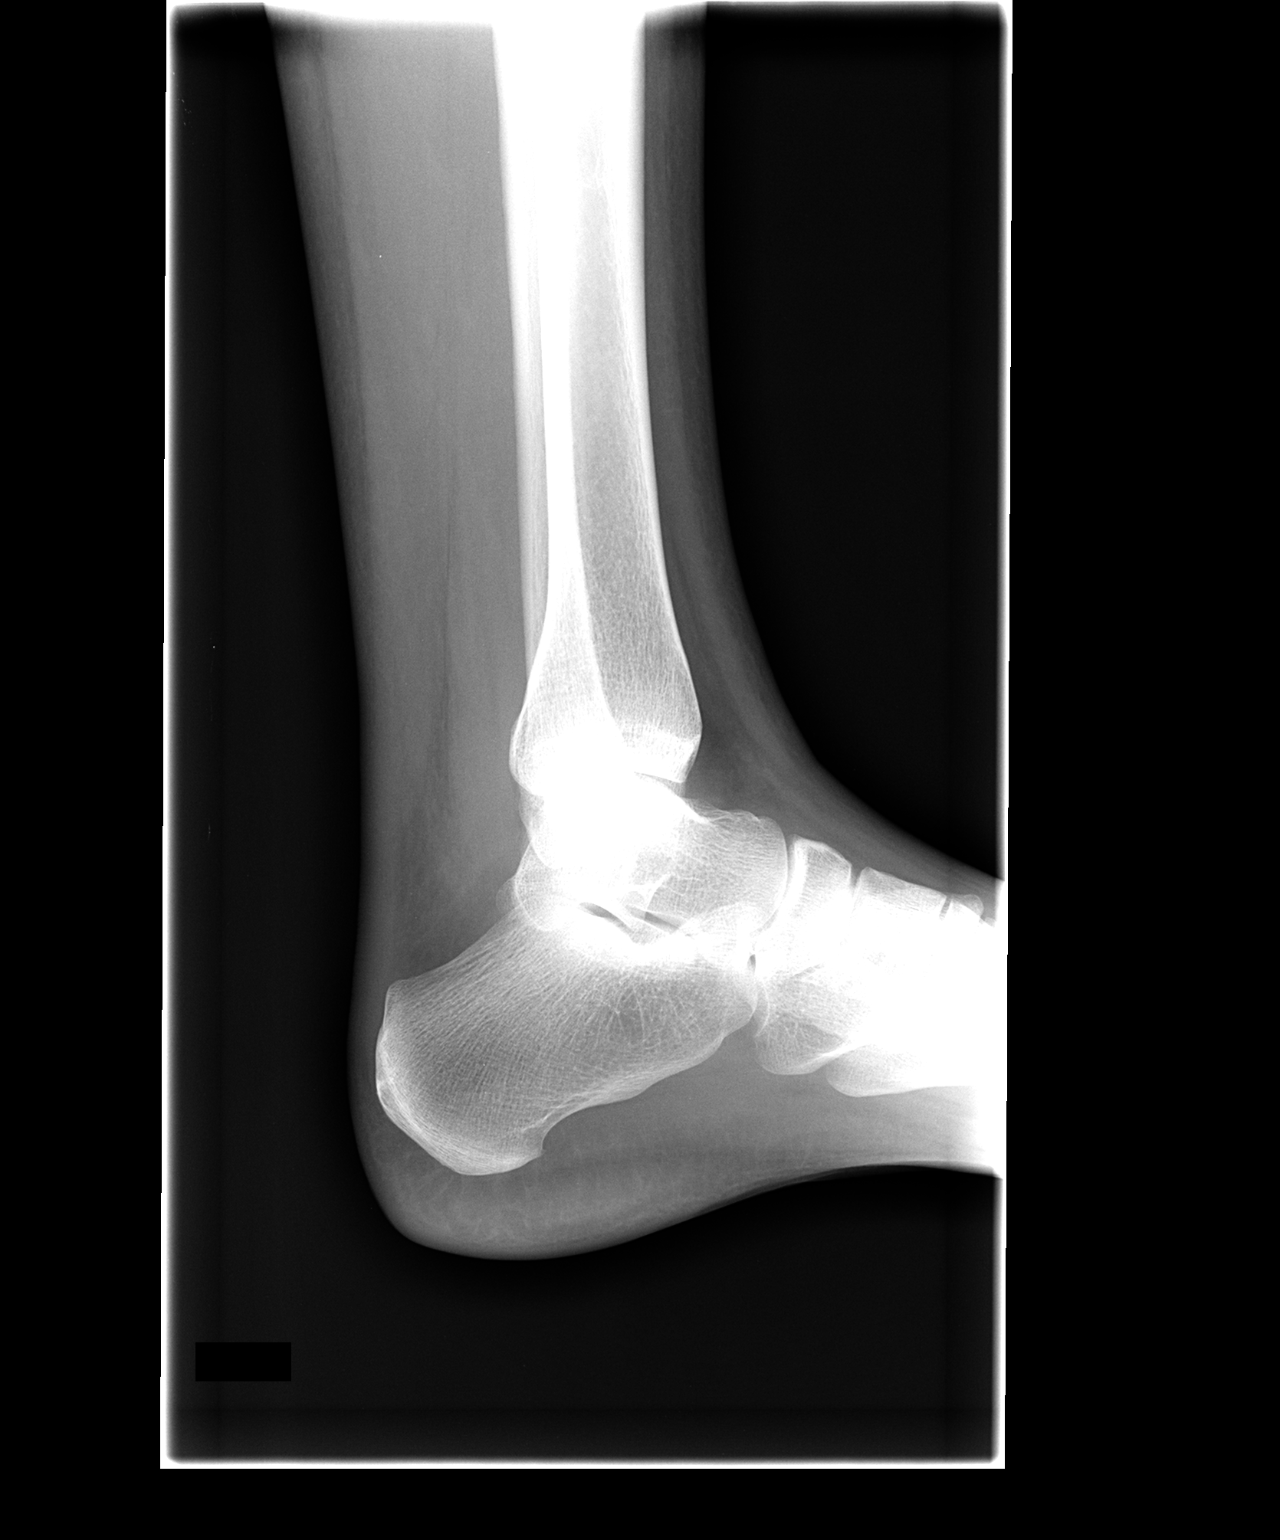

[2 of 2 positions shown; findings below may reference images not displayed]

FINDINGS: Slight medial nonspecific soft tissue swelling seen.

Ankle mortise symmetric.

No other significant osseous, articular or soft tissue
abnormalities seen.
IMPRESSION: 1.  Slight nonspecific medial soft tissue swelling and ankle.
2.  Otherwise, negative.

## 2013-06-13 ENCOUNTER — Ambulatory Visit (INDEPENDENT_AMBULATORY_CARE_PROVIDER_SITE_OTHER): Payer: BC Managed Care – PPO | Admitting: Family Medicine

## 2013-06-13 ENCOUNTER — Encounter: Payer: Self-pay | Admitting: Family Medicine

## 2013-06-13 VITALS — BP 135/92 | HR 68 | Temp 97.6°F | Wt 129.0 lb

## 2013-06-13 DIAGNOSIS — H6980 Other specified disorders of Eustachian tube, unspecified ear: Secondary | ICD-10-CM

## 2013-06-13 NOTE — Progress Notes (Signed)
**Note Felicia via Obfuscation** CC: [name] is a 38 y.o. female is here for left ear pain   Subjective: HPI:  Left ear pain described as sharp, persistent, mild, nonradiating and has been present for the past 5 days without any improvement or decline without any interventions. Denies any recent trauma to the ear or head.  Was preceded by nasal congestion, sore throat, cough which resolved 100% over the weekend.  Denies fevers, chills, hearing loss, tinnitus, headache, dizziness, shortness of breath, cough. Denies discharge from either ear   Review Of Systems Outlined In HPI  Past Medical History  Diagnosis Date  . IUD 03-09-07  . Hypertension   . Ankle swelling   . Edema   . Rheumatoid arthritis(714.0)   . Migraine     Past Surgical History  Procedure Laterality Date  . Mandible fracture surgery      At age 24   Family History  Problem Relation Age of Onset  . Heart attack      grandfather  . Hyperlipidemia Mother   . Hypertension Father   . Diabetes Maternal Grandmother   . Stroke Maternal Grandmother   . Diabetes Maternal Grandfather   . Heart disease Paternal Grandfather     History   Social History  . Marital Status: Married    Spouse Name: N/A    Number of Children: 1  . Years of Education: N/A   Occupational History  . Not on file.   Social History Main Topics  . Smoking status: Never Smoker   . Smokeless tobacco: Not on file  . Alcohol Use: 0.5 oz/week    1 drink(s) per week     Comment: per week  . Drug Use: No  . 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   Other Topics Concern  . Not on file   Social History Narrative  . No narrative on file     Objective: BP 135/92  Pulse 68  Temp(Src) 97.6 F (36.4 C) (Oral)  Wt 129 lb (58.514 kg)  General: Alert and Oriented, No Acute Distress HEENT: Pupils equal, round, reactive to light. Conjunctivae clear.  External  ears unremarkable, canals clear with intact TMs with appropriate landmarks.  Right Middle ear appears open without effusion. Pink inferior turbinates.  Left middle ear has a mild serous effusion. Moist mucous membranes, pharynx without inflammation nor lesions.  Neck supple without palpable lymphadenopathy nor abnormal masses. Lungs: Clear and comfortable work of breathing Mental Status: No depression, anxiety, nor agitation. Skin: Warm and dry.  Assessment & Plan: [name] was seen today for left ear pain.  Diagnoses and associated orders for this visit:  Eustachian tube dysfunction    Left eustachian tube dysfunction causing left ear pain, begin either Flonase or Nasacort whichever is cheaper for at least the next 2 weeks. If no signs of improvement by Friday call me the next step would be oral prednisone.  Return if symptoms worsen or fail to improve.

## 2013-09-11 ENCOUNTER — Ambulatory Visit (INDEPENDENT_AMBULATORY_CARE_PROVIDER_SITE_OTHER): Payer: BC Managed Care – PPO | Admitting: Family Medicine

## 2013-09-11 ENCOUNTER — Encounter: Payer: Self-pay | Admitting: Family Medicine

## 2013-09-11 VITALS — BP 126/84 | HR 84 | Wt 130.0 lb

## 2013-09-11 DIAGNOSIS — G43109 Migraine with aura, not intractable, without status migrainosus: Secondary | ICD-10-CM | POA: Diagnosis not present

## 2013-09-11 DIAGNOSIS — I10 Essential (primary) hypertension: Secondary | ICD-10-CM

## 2013-09-11 NOTE — Progress Notes (Signed)
   Subjective:    Patient ID: Felicia James, female    DOB: 27-Dec-1975, 38 y.o.   MRN: [id number]  Hypertension   Hypertension- Pt denies chest pain, SOB, dizziness, or heart palpitations.  Taking meds as directed w/o problems.  Denies medication side effects.     migraine with aura-she says that over the summer her migraines have been very well-controlled. She does tend to get more than the school year. 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 We had originally consider switching her to a beta blocker to help with her blood pressure in her migraines.  Review of Systems     Objective:   Physical Exam  Constitutional: She is oriented to person, place, and time. She appears well-developed and well-nourished.  HENT:  Head: Normocephalic and atraumatic.  Cardiovascular: Normal rate, regular rhythm and normal heart sounds.   Pulmonary/Chest: Effort normal and breath sounds normal.  Neurological: She is alert and oriented to person, place, and time.  Skin: Skin is warm and dry.  Psychiatric: She has a normal mood and affect. Her behavior is normal.          Assessment & Plan:  HTN - well controlled.  Continue current regimen. She's overdue for CMP and fasting lipid panel. In fact I reprinted her lab slip today. She did not go in March. Followup in 6 months.  Migraine with aura-currently well controlled. She will let me know if she starts to have more frequent headaches as the school year starts back.

## 2013-09-18 ENCOUNTER — Other Ambulatory Visit: Payer: Self-pay | Admitting: Family Medicine

## 2014-01-17 LAB — HM PAP SMEAR: HM Pap smear: NEGATIVE

## 2015-12-09 LAB — HM MAMMOGRAPHY

## 2016-02-27 ENCOUNTER — Ambulatory Visit (INDEPENDENT_AMBULATORY_CARE_PROVIDER_SITE_OTHER): Payer: BC Managed Care – PPO | Admitting: Family Medicine

## 2016-02-27 ENCOUNTER — Encounter: Payer: Self-pay | Admitting: Family Medicine

## 2016-02-27 VITALS — BP 161/88 | HR 84 | Ht 66.0 in | Wt 132.0 lb

## 2016-02-27 DIAGNOSIS — G43101 Migraine with aura, not intractable, with status migrainosus: Secondary | ICD-10-CM

## 2016-02-27 DIAGNOSIS — Z23 Encounter for immunization: Secondary | ICD-10-CM | POA: Diagnosis not present

## 2016-02-27 DIAGNOSIS — R030 Elevated blood-pressure reading, without diagnosis of hypertension: Secondary | ICD-10-CM

## 2016-02-27 MED ORDER — PROPRANOLOL HCL 40 MG PO TABS: 40.0000 mg | ORAL_TABLET | Freq: Two times a day (BID) | ORAL | 3 refills | Status: DC

## 2016-02-27 MED ORDER — RIZATRIPTAN BENZOATE 10 MG PO TBDP: 10.0000 mg | ORAL_TABLET | ORAL | 6 refills | Status: DC | PRN

## 2016-02-27 NOTE — Progress Notes (Signed)
Subjective:    Patient ID: Felicia SinnerStacie M Buser, female    DOB: August 03, 1975, 41 y.o.   MRN: [id number]  HPI 41 yo female with hx fo migraine HA.  Has been on prophylaxis in the past.  Has had a HA all day today.  She has been taking excedrin.  Used to see a Insurance account managereurologist Years ago. They've tried several different medications for prophylaxis. Topamax actually caused significant weight loss. Depakote actually caused her to gain weight. They tried something else she can't quite remember what it was. She was on metoprolol 1. that that was really more for blood pressure issues. For acute treatment she has tried Imitrex and it really didn't work very well. She's also tried Relpax and Maxalt. She doesn't remember any specific problems with those 2 drugs. Her headaches have been almost weekly and typically last 12 hours and sometimes a little longer. She does occasionally get aura with her migraines. Is typically a smell sensation of smoke. She does actually get nauseated and vomit sometimes with her headache. She does have a headache today which she describes as throbbing.      Review of Systems   BP (!) 161/88   Pulse 84   Ht 5\' 6"  (1.676 m)   Wt 132 lb (59.9 kg)   SpO2 100%   BMI 21.31 kg/m     No Known Allergies  Past Medical History:  Diagnosis Date  . Ankle swelling   . Edema   . Hypertension   . IUD 03-09-07  . Migraine   . Rheumatoid arthritis(714.0)     Past Surgical History:  Procedure Laterality Date  . MANDIBLE FRACTURE SURGERY     At age 89    Social History   Social History  . Marital status: Married    Spouse name: N/A  . Number of children: 1  . Years of education: N/A   Occupational History  . Not on file.   Social History Main Topics  . Smoking status: Never Smoker  . Smokeless tobacco: Never Used  . Alcohol use 0.5 oz/week    1 Standard drinks or equivalent per week     Comment: per week  . Drug use: No  . 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   Other Topics Concern  . Not on file   Social History Narrative  . No narrative on file    Family History  Problem Relation Age of Onset  . Heart attack      grandfather  . Hyperlipidemia Mother   . Hypertension Father   . Diabetes Maternal Grandmother   . Stroke Maternal Grandmother   . Diabetes Maternal Grandfather   . Heart disease Paternal Grandfather     Outpatient Encounter Prescriptions as of 02/27/2016  Medication Sig  . CAMILA 0.35 MG tablet   . propranolol (INDERAL) 40 MG tablet Take 1 tablet (40 mg total) by mouth 2 (two) times daily.  . rizatriptan (MAXALT-MLT) 10 MG disintegrating tablet Take 1 tablet (10 mg total) by mouth as needed for migraine. May repeat in 2 hours if needed  . [DISCONTINUED] metoprolol succinate (TOPROL-XL) 25 MG 24 hr tablet TAKE 1 TABLET (25 MG TOTAL) BY MOUTH DAILY.  . [DISCONTINUED] metoprolol succinate (TOPROL-XL) 25 MG 24 hr tablet TAKE 1 TABLET (25 MG TOTAL) BY MOUTH DAILY.   No facility-administered encounter medications on file  as of 02/27/2016.          Objective:   Physical Exam  Constitutional: She is oriented to person, place, and time. She appears well-developed and well-nourished.  HENT:  Head: Normocephalic and atraumatic.  Neck: Neck supple. No thyromegaly present.  Cardiovascular: Normal rate, regular rhythm and normal heart sounds.   Pulmonary/Chest: Effort normal and breath sounds normal.  Lymphadenopathy:    She has no cervical adenopathy.  Neurological: She is alert and oriented to person, place, and time.  Skin: Skin is warm and dry.  Psychiatric: She has a normal mood and affect. Her behavior is normal.        Assessment & Plan:   Migraine with aura - Discussed diagnosis with patient. We're start her back on prophylaxis. We'll put her on propranolol which has some good data for migraines in addition will help  lower her blood pressure which will be helpful. We'll also put her on Maxalt melts for acute relief. She can also in her changes with her Excedrin Migraine if she feels like it's a more mild headache. Work on regular exercise healthy diet and getting adequate sleep. This can make a big difference in controlling migraines. Follow-up in 2 months. Also needs to avoid birth controls with estrogen since she does get aura with her headaches.  Acute Migraine - declined acute treatment.   Elevated  blood pressure-normally her blood pressure looks fantastic. We'll recheck this when I see her back in 2 months.  Flu vaccine given today.

## 2016-06-17 ENCOUNTER — Other Ambulatory Visit: Payer: Self-pay | Admitting: Family Medicine

## 2016-07-14 ENCOUNTER — Other Ambulatory Visit: Payer: Self-pay | Admitting: Family Medicine

## 2016-07-20 ENCOUNTER — Ambulatory Visit (INDEPENDENT_AMBULATORY_CARE_PROVIDER_SITE_OTHER): Payer: BC Managed Care – PPO | Admitting: Family Medicine

## 2016-07-20 ENCOUNTER — Encounter: Payer: Self-pay | Admitting: Family Medicine

## 2016-07-20 VITALS — BP 133/79 | HR 55 | Resp 18 | Wt 135.2 lb

## 2016-07-20 DIAGNOSIS — Z13220 Encounter for screening for lipoid disorders: Secondary | ICD-10-CM | POA: Diagnosis not present

## 2016-07-20 DIAGNOSIS — R030 Elevated blood-pressure reading, without diagnosis of hypertension: Secondary | ICD-10-CM

## 2016-07-20 DIAGNOSIS — G43109 Migraine with aura, not intractable, without status migrainosus: Secondary | ICD-10-CM

## 2016-07-20 NOTE — Progress Notes (Signed)
Subjective:    CC: HTN, migraines  HPI:  Elevated BP without diagnosis of hypertension.   She is feeling well today.    Migraine HA - she feels her migraines are much better overall. She is on propranolol for prophylaxis. She is only getting one migraine per month. She is getting some more low-grade headaches but says that if she takes her Maxalt quickly it will usually abort the headache very efficiently. She's not having any side effects on her medication regimen.  [facility] and had her mammogram done at [facility] last fall.  Past medical history, Surgical history, Family history not pertinant except as noted below, Social history, Allergies, and medications have been entered into the medical record, reviewed, and corrections made.   Review of Systems: No fevers, chills, night sweats, weight loss, chest pain, or shortness of breath.   Objective:    General: Well Developed, well nourished, and in no acute distress.  Neuro: Alert and oriented x3, extra-ocular muscles intact, sensation grossly intact.  HEENT: Normocephalic, atraumatic  Skin: Warm and dry, no rashes. Cardiac: Regular rate and rhythm, no murmurs rubs or gallops, no lower extremity edema.  Respiratory: Clear to auscultation bilaterally. Not using accessory muscles, speaking in full sentences.   Impression and Recommendations:    Elevated BP with no dx HTN- Well controlled. Continue current regimen. Follow up in  6 months.  Due for CMP and lipids.   Migraines w/ aura - doing well. Continue current regimen. Follow-up in 6 months. Okay to refill.  Mammogram-we'll obtain record from care everywhere.  She is due for a tear. She says she will get it at the next office visit.

## 2016-07-30 ENCOUNTER — Encounter: Payer: Self-pay | Admitting: Family Medicine

## 2016-09-12 ENCOUNTER — Other Ambulatory Visit: Payer: Self-pay | Admitting: Family Medicine

## 2017-01-08 ENCOUNTER — Other Ambulatory Visit: Payer: Self-pay | Admitting: *Deleted

## 2017-01-08 MED ORDER — PROPRANOLOL HCL 40 MG PO TABS: 40.0000 mg | ORAL_TABLET | Freq: Two times a day (BID) | ORAL | 1 refills | Status: DC

## 2017-03-26 ENCOUNTER — Ambulatory Visit: Payer: BC Managed Care – PPO | Admitting: Family Medicine

## 2017-03-26 ENCOUNTER — Encounter: Payer: Self-pay | Admitting: Family Medicine

## 2017-03-26 VITALS — BP 138/83 | HR 65 | Ht 66.0 in | Wt 141.0 lb

## 2017-03-26 DIAGNOSIS — G43109 Migraine with aura, not intractable, without status migrainosus: Secondary | ICD-10-CM | POA: Diagnosis not present

## 2017-03-26 DIAGNOSIS — R030 Elevated blood-pressure reading, without diagnosis of hypertension: Secondary | ICD-10-CM

## 2017-03-26 MED ORDER — RIZATRIPTAN BENZOATE 10 MG PO TBDP: 10.0000 mg | ORAL_TABLET | ORAL | 6 refills | Status: DC | PRN

## 2017-03-26 MED ORDER — PROPRANOLOL HCL 40 MG PO TABS: 40.0000 mg | ORAL_TABLET | Freq: Two times a day (BID) | ORAL | 1 refills | Status: DC

## 2017-03-26 MED ORDER — PROPRANOLOL HCL 40 MG PO TABS: 40.0000 mg | ORAL_TABLET | Freq: Two times a day (BID) | ORAL | 3 refills | Status: DC

## 2017-03-26 NOTE — Progress Notes (Signed)
Subjective:    CC: Migraine Headach.    HPI: Follow-up migraine headaches with aura -she has had increase in frequency of migraine headaches.  To particularly weather changes a trigger for her so this past week she is actually had a migraine daily for almost 4 days.  She says the Maxalt that works really well and in fact if she can catch it early it almost always aborts the headache.  She is on propranolol.  She has been on multiple drugs over the last 10 years and this really is the one that works best for her.  She reports that on average she is getting about 6 migraines per month.  Elevated blood pressure-she has a family history of high blood pressure and heart disease and so wants to just keep an eye on her blood pressure.  Her father had a heart attack and passed away at age 31.    BP 138/83   Pulse 65   Ht 5\' 6"  (1.676 m)   Wt 141 lb (64 kg)   SpO2 100%   BMI 22.76 kg/m     No Known Allergies  Past Medical History:  Diagnosis Date  . Ankle swelling   . Edema   . Hypertension   . IUD 03-09-07  . Migraine   . Rheumatoid arthritis(714.0)     Past Surgical History:  Procedure Laterality Date  . MANDIBLE FRACTURE SURGERY     At age 4    Social History   Socioeconomic History  . Marital status: Married    Spouse name: Not on file  . Number of children: 1  . Years of education: Not on file  . Highest education level: Not on file  Social Needs  . Financial resource strain: Not on file  . Food insecurity - worry: Not on file  . Food insecurity - inability: Not on file  . Transportation needs - medical: Not on file  . Transportation needs - non-medical: Not on file  Occupational History  . Not on file  Tobacco Use  . Smoking status: Never Smoker  . Smokeless tobacco: Never Used  Substance and Sexual Activity  . Alcohol use: Yes    Alcohol/week: 0.5 oz    Types: 1 Standard drinks or equivalent per week    Comment: per week  . Drug use: No  . 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  Other Topics Concern  . Not on file  Social History Narrative  . Not on file    Family History  Problem Relation Age of Onset  . Hypertension Father   . Heart attack Father 43  . Heart attack Unknown        grandfather  . Hyperlipidemia Mother   . Diabetes Maternal Grandmother   . Stroke Maternal Grandmother   . Diabetes Maternal Grandfather   . Heart disease Paternal Grandfather     Outpatient Encounter Medications as of 03/26/2017  Medication Sig  . CAMILA 0.35 MG tablet   . propranolol (INDERAL) 40 MG tablet Take 1 tablet (40 mg total) by mouth 2 (two) times daily.  . rizatriptan (MAXALT-MLT) 10 MG disintegrating tablet Take 1 tablet (10 mg total) by mouth as needed for migraine. May repeat in 2 hours if needed  . [DISCONTINUED] propranolol (INDERAL) 40 MG tablet Take 1 tablet (40 mg total) by mouth 2 (two) times daily.  . [  DISCONTINUED] propranolol (INDERAL) 40 MG tablet Take 1 tablet (40 mg total) by mouth 2 (two) times daily.  . [DISCONTINUED] rizatriptan (MAXALT-MLT) 10 MG disintegrating tablet TAKE 1 TABLET (10 MG TOTAL) BY MOUTH AS NEEDED FOR MIGRAINE. MAY REPEAT IN 2 HOURS IF NEEDED   No facility-administered encounter medications on file as of 03/26/2017.        Review of Systems: No fevers, chills, night sweats, weight loss, chest pain, or shortness of breath.   Objective:    General: Well Developed, well nourished, and in no acute distress.  Neuro: Alert and oriented x3, extra-ocular muscles intact, sensation grossly intact.  HEENT: Normocephalic, atraumatic  Skin: Warm and dry, no rashes. Cardiac: Regular rate and rhythm, no murmurs rubs or gallops, no lower extremity edema.  Respiratory: Clear to auscultation bilaterally. Not using accessory muscles, speaking in full sentences.   Impression and Recommendations:    Migraine  headaches-she still is having quite frequent headaches.  More than would be recommended but at this point in time she wants to just continue with her propranolol as it is the best medication that she is tried.  We did discuss that there are some newer medications on the market that certainly could be considered if she continues to have more frequent headaches.  Maxalt is very effective so we will continue with that.  Refills sent to pharmacy.  Elevated blood pressure without diagnosis hypertension-continue to monitor.  Follow-up in 6-12 months.

## 2017-03-31 ENCOUNTER — Other Ambulatory Visit: Payer: Self-pay | Admitting: Family Medicine

## 2017-05-04 ENCOUNTER — Encounter: Payer: Self-pay | Admitting: Family Medicine

## 2017-05-04 NOTE — Progress Notes (Signed)
Negative for intraepithelial lesion or malignancy.  

## 2017-10-19 ENCOUNTER — Ambulatory Visit (INDEPENDENT_AMBULATORY_CARE_PROVIDER_SITE_OTHER): Payer: BC Managed Care – PPO | Admitting: Family Medicine

## 2017-10-19 ENCOUNTER — Encounter: Payer: Self-pay | Admitting: Family Medicine

## 2017-10-19 VITALS — BP 132/75 | HR 60 | Ht 66.0 in | Wt 135.0 lb

## 2017-10-19 DIAGNOSIS — G43109 Migraine with aura, not intractable, without status migrainosus: Secondary | ICD-10-CM | POA: Diagnosis not present

## 2017-10-19 DIAGNOSIS — Z23 Encounter for immunization: Secondary | ICD-10-CM | POA: Diagnosis not present

## 2017-10-19 DIAGNOSIS — Z13220 Encounter for screening for lipoid disorders: Secondary | ICD-10-CM

## 2017-10-19 MED ORDER — PROPRANOLOL HCL 40 MG PO TABS: 40.0000 mg | ORAL_TABLET | Freq: Two times a day (BID) | ORAL | 3 refills | Status: DC

## 2017-10-19 MED ORDER — RIZATRIPTAN BENZOATE 10 MG PO TBDP: 10.0000 mg | ORAL_TABLET | ORAL | 6 refills | Status: DC | PRN

## 2017-10-19 NOTE — Progress Notes (Signed)
   Subjective:    Patient ID: Felicia James, female    DOB: October 07, 1975, 42 y.o.   MRN: [id number]  HPI  42 year old female comes in today complaining of migraine with aura-here for routine follow-up.  She is currently on atenolol for prophylaxis and is using Maxalt as rescue.  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  She does need a refill on her Maxalt.  She says most the time she can feel the pressure building behind her eyes and take her medication and that usually will abort the headache.  She is still on Camilla for birth control but does have a follow-up with OB/GYN later today.  Review of Systems     Objective:   Physical Exam  Constitutional: She is oriented to person, place, and time. She appears well-developed and well-nourished.  HENT:  Head: Normocephalic and atraumatic.  Cardiovascular: Normal rate, regular rhythm and normal heart sounds.  Pulmonary/Chest: Effort normal and breath sounds normal.  Neurological: She is alert and oriented to person, place, and time.  Skin: Skin is warm and dry.  Psychiatric: She has a normal mood and affect. Her behavior is normal.          Assessment & Plan:  Migraine with aura -doing well on current regimen.  Continue with propranolol and as needed Maxalt.  Follow-up in 6 months if any problems please let us know.  Encouraged her to go for some screening blood work.  Actually do not have any labs on her since she has been a patient here so would like to at least get a baseline on her lipids renal and liver function.  Due For screening lipid test.  Vaccine given today.

## 2017-11-16 LAB — COMPLETE METABOLIC PANEL WITH GFR
AG RATIO: 1.7 (calc) (ref 1.0–2.5)
ALBUMIN MSPROF: 4.3 g/dL (ref 3.6–5.1)
ALT: 18 U/L (ref 6–29)
AST: 13 U/L (ref 10–30)
Alkaline phosphatase (APISO): 64 U/L (ref 33–115)
BUN: 14 mg/dL (ref 7–25)
CALCIUM: 9.3 mg/dL (ref 8.6–10.2)
CO2: 27 mmol/L (ref 20–32)
CREATININE: 0.68 mg/dL (ref 0.50–1.10)
Chloride: 107 mmol/L (ref 98–110)
GFR, EST NON AFRICAN AMERICAN: 108 mL/min/{1.73_m2} (ref >=60)
GFR, Est African American: 125 mL/min/{1.73_m2} (ref >=60)
Globulin: 2.5 g/dL (calc) (ref 1.9–3.7)
Glucose, Bld: 97 mg/dL (ref 65–99)
POTASSIUM: 4.2 mmol/L (ref 3.5–5.3)
SODIUM: 141 mmol/L (ref 135–146)
TOTAL PROTEIN: 6.8 g/dL (ref 6.1–8.1)
Total Bilirubin: 0.8 mg/dL (ref 0.2–1.2)

## 2017-11-16 LAB — LIPID PANEL
Cholesterol: 150 mg/dL (ref <200)
HDL: 49 mg/dL — ABNORMAL LOW (ref >50)
LDL Cholesterol (Calc): 82 mg/dL (calc)
NON-HDL CHOLESTEROL (CALC): 101 mg/dL (ref <130)
Total CHOL/HDL Ratio: 3.1 (calc) (ref <5.0)
Triglycerides: 96 mg/dL (ref <150)

## 2018-01-22 ENCOUNTER — Other Ambulatory Visit: Payer: Self-pay

## 2018-01-22 ENCOUNTER — Emergency Department
Admission: EM | Admit: 2018-01-22 | Discharge: 2018-01-22 | Disposition: A | Discharge status: Home / Self Care | Payer: BC Managed Care – PPO | Source: Home / Self Care | Attending: Family Medicine | Admitting: Family Medicine

## 2018-01-22 ENCOUNTER — Encounter: Payer: Self-pay | Admitting: Emergency Medicine

## 2018-01-22 DIAGNOSIS — R51 Headache: Secondary | ICD-10-CM

## 2018-01-22 DIAGNOSIS — B9789 Other viral agents as the cause of diseases classified elsewhere: Secondary | ICD-10-CM

## 2018-01-22 DIAGNOSIS — R519 Headache, unspecified: Secondary | ICD-10-CM

## 2018-01-22 DIAGNOSIS — J069 Acute upper respiratory infection, unspecified: Secondary | ICD-10-CM

## 2018-01-22 MED ORDER — DEXAMETHASONE SODIUM PHOSPHATE 10 MG/ML IJ SOLN
10.0000 mg | Freq: Once | INTRAMUSCULAR | Status: AC
  Administered 2018-01-22: 10 mg via INTRAMUSCULAR

## 2018-01-22 MED ORDER — KETOROLAC TROMETHAMINE 60 MG/2ML IM SOLN
60.0000 mg | Freq: Once | INTRAMUSCULAR | Status: AC
  Administered 2018-01-22: 60 mg via INTRAMUSCULAR

## 2018-01-22 NOTE — Discharge Instructions (Addendum)
Take plain guaifenesin (1200mg  extended release tabs such as Mucinex) twice daily, with plenty of water, for cough and congestion.  May add Pseudoephedrine (30mg , one or two every 4 to 6 hours) for sinus congestion.  Get adequate rest.   May use Afrin nasal spray (or generic oxymetazoline) each morning for about 5 days and then discontinue.  Also recommend using saline nasal spray several times daily and saline nasal irrigation (AYR is a common brand).    Try warm salt water gargles for sore throat.  May take Delsym Cough Suppressant at bedtime for nighttime cough.  Stop all antihistamines for now, and other non-prescription cough/cold preparations. May take Ibuprofen 200mg , 4 tabs every 8 hours with food for headache.

## 2018-01-22 NOTE — ED Provider Notes (Signed)
[facility]    CSN: [id number] Arrival date & time: 01/22/18  1301     History   Chief Complaint Chief Complaint  Patient presents with  . Headache  . Neck Pain    HPI Felicia James is a 43 y.o. female.   Patient developed a sore throat and fatigue about 5 days ago, with soreness in her posterior neck radiating to her occipital area.  She has a history of unilateral migraine headaches that usually respond to Maxalt.  Her present headache is generalized, milder than her usual migraines and has not responded to Maxalt.  She next developed right "pink eye" that rapidly resolved, followed by sinus congestion, and now a mild occasional cough.  She denies fevers, chills, and sweats.  The history is provided by the patient.    Past Medical History:  Diagnosis Date  . Ankle swelling   . Edema   . Hypertension   . IUD 03-09-07  . Migraine   . Rheumatoid arthritis(714.0)     Patient Active Problem List   Diagnosis Date Noted  . Single live birth 07/27/2014  . Migraine with aura 09/11/2013  . LYMPHADENOPATHY 07/01/2009  . POLYARTHRITIS 01/08/2009  . KNEE PAIN 07/05/2008    Past Surgical History:  Procedure Laterality Date  . MANDIBLE FRACTURE SURGERY     At age 67    OB History   No obstetric history on file.      Home Medications    Prior to Admission medications   Medication Sig Start Date End Date Taking? Authorizing Provider  medroxyPROGESTERone (DEPO-PROVERA) 150 MG/ML injection Inject 150 mg into the muscle every 3 (three) months.   Yes [provider]  norethindrone (MICRONOR,CAMILA,ERRIN) 0.35 MG tablet Take 1 tablet by mouth daily. 10/15/17 10/15/18  [provider]  propranolol (INDERAL) 40 MG tablet Take 1 tablet (40 mg total) by mouth 2 (two) times daily. 10/19/17   Agapito Games, MD  rizatriptan (MAXALT-MLT) 10 MG disintegrating tablet Take 1 tablet (10 mg total) by mouth as needed for migraine. May repeat in 2 hours  if needed 10/19/17   Agapito Games, MD    Family History Family History  Problem Relation Age of Onset  . Hypertension Father   . Heart attack Father 70  . Heart attack Other        grandfather  . Hyperlipidemia Mother   . Diabetes Maternal Grandmother   . Stroke Maternal Grandmother   . Diabetes Maternal Grandfather   . Heart disease Paternal Grandfather     Social History Social History   Tobacco Use  . Smoking status: Never Smoker  . Smokeless tobacco: Never Used  Substance Use Topics  . Alcohol use: Yes    Alcohol/week: 1.0 standard drinks    Types: 1 Standard drinks or equivalent per week    Comment: per week  . Drug use: No     Allergies   Patient has no known allergies.   Review of Systems Review of Systems + sore throat + occasional cough No pleuritic pain No wheezing + nasal congestion + post-nasal drainage + sinus pain/pressure No itchy/red eyes No earache No hemoptysis No SOB No fever/chills No nausea No vomiting No abdominal pain No diarrhea No urinary symptoms No skin rash + fatigue No myalgias + headache    Physical Exam Triage Vital Signs ED Triage Vitals  Enc Vitals Group     BP --      Pulse Rate 01/22/18 1343 74  Resp 01/22/18 1343 16     Temp 01/22/18 1343 (!) 97.5 F (36.4 C)     Temp Source 01/22/18 1343 Oral     SpO2 01/22/18 1343 100 %     Weight 01/22/18 1344 130 lb (59 kg)     Height 01/22/18 1344 5\' 6"  (1.676 m)     Head Circumference --      Peak Flow --      Pain Score 01/22/18 1344 4     Pain Loc --      Pain Edu? --      Excl. in GC? --    No data found.  Updated Vital Signs Pulse 74   Temp (!) 97.5 F (36.4 C) (Oral)   Resp 16   Ht 5\' 6"  (1.676 m)   Wt 59 kg   SpO2 100%   BMI 20.98 kg/m   Visual Acuity Right Eye Distance:   Left Eye Distance:   Bilateral Distance:    Right Eye Near:   Left Eye Near:    Bilateral Near:     Physical Exam Nursing notes and Vital Signs  reviewed. Appearance:  Patient appears stated age, and in no acute distress Eyes:  Pupils are equal, round, and reactive to light and accomodation.  Extraocular movement is intact.  Conjunctivae are not inflamed.  Fundi benign. Ears:  Canals normal.  Tympanic membranes normal.  Nose:  Mildly congested turbinates.  No sinus tenderness.    Pharynx:  Normal Neck:  Supple.  Enlarged posterior/lateral nodes are palpated bilaterally, tender to palpation on the left.   Lungs:  Clear to auscultation.  Breath sounds are equal.  Moving air well. Heart:  Regular rate and rhythm without murmurs, rubs, or gallops.  Abdomen:  Nontender without masses or hepatosplenomegaly.  Bowel sounds are present.  No CVA or flank tenderness.  Extremities:  No edema.  Skin:  No rash present.   Neurologic:  Cranial nerves 2 through 12 are normal.     UC Treatments / Results  Labs (all labs ordered are listed, but only abnormal results are displayed) Labs Reviewed - No data to display  EKG None  Radiology No results found.  Procedures Procedures (including critical care time)  Medications Ordered in UC Medications  ketorolac (TORADOL) injection 60 mg (has no administration in time range)  dexamethasone (DECADRON) injection 10 mg (has no administration in time range)    Initial Impression / Assessment and Plan / UC Course  I have reviewed the triage vital signs and the nursing notes.  Pertinent labs & imaging results that were available during my care of the patient were reviewed by me and considered in my medical decision making (see chart for details).    Patient appears to have a persistent hybrid headache precipitated by her URI. Administered Toradol 60mg  IM  Administered Depo Medrol 80mg  IM. There is no evidence of bacterial infection today; treat URI symptomatically for now.   Followup with Family Doctor if not improved in about 5 days.   Final Clinical Impressions(s) / UC Diagnoses   Final  diagnoses:  Viral URI with cough  Nonintractable episodic headache, unspecified headache type     Discharge Instructions     Take plain guaifenesin (1200mg  extended release tabs such as Mucinex) twice daily, with plenty of water, for cough and congestion.  May add Pseudoephedrine (30mg , one or two every 4 to 6 hours) for sinus congestion.  Get adequate rest.   May use Afrin nasal spray (  or generic oxymetazoline) each morning for about 5 days and then discontinue.  Also recommend using saline nasal spray several times daily and saline nasal irrigation (AYR is a common brand).    Try warm salt water gargles for sore throat.  May take Delsym Cough Suppressant at bedtime for nighttime cough.  Stop all antihistamines for now, and other non-prescription cough/cold preparations. May take Ibuprofen 200mg , 4 tabs every 8 hours with food for headache.      ED Prescriptions    None         Lattie HawBeese, Caedyn Raygoza A, MD 01/22/18 [id number]

## 2018-01-22 NOTE — ED Triage Notes (Signed)
Patient reports 6 days of headaches and upper neck pain; has treated herself for migraine and does not feel that is source of discomfort. Took Maxalt at 1100.

## 2018-03-08 ENCOUNTER — Encounter: Payer: Self-pay | Admitting: Family Medicine

## 2018-03-08 ENCOUNTER — Ambulatory Visit (INDEPENDENT_AMBULATORY_CARE_PROVIDER_SITE_OTHER): Payer: BC Managed Care – PPO | Admitting: Family Medicine

## 2018-03-08 DIAGNOSIS — G43109 Migraine with aura, not intractable, without status migrainosus: Secondary | ICD-10-CM

## 2018-03-08 MED ORDER — RIZATRIPTAN BENZOATE 10 MG PO TBDP: 10.0000 mg | ORAL_TABLET | ORAL | 6 refills | Status: DC | PRN

## 2018-03-08 MED ORDER — PROPRANOLOL HCL 40 MG PO TABS: 40.0000 mg | ORAL_TABLET | Freq: Two times a day (BID) | ORAL | 3 refills | Status: DC

## 2018-03-08 NOTE — Progress Notes (Signed)
Subjective:    CC:   HPI: Follow-up migraine headaches as well she does have aura with her headaches.  She is currently on propranolol and as needed Maxalt.  She feels like the Maxalt works really well when she takes it.  She still gets a few migraines but feels like for the most part they are well controlled with the propranolol she is happy with her regimen.  She denies any side effects of the medication.  She does get her GYN yearly regularly.  Past medical history, Surgical history, Family history not pertinant except as noted below, Social history, Allergies, and medications have been entered into the medical record, reviewed, and corrections made.   Review of Systems: No fevers, chills, night sweats, weight loss, chest pain, or shortness of breath.   Objective:    General: Well Developed, well nourished, and in no acute distress.  Neuro: Alert and oriented x3, extra-ocular muscles intact, sensation grossly intact.  HEENT: Normocephalic, atraumatic  Skin: Warm and dry, no rashes. Cardiac: Regular rate and rhythm, no murmurs rubs or gallops, no lower extremity edema.  Respiratory: Clear to auscultation bilaterally. Not using accessory muscles, speaking in full sentences.   Impression and Recommendations:    Migraine HA  -doing well overall on current regimen refills sent to pharmacy.  Follow-up in 1 year or sooner if she is having problems.

## 2018-09-13 ENCOUNTER — Other Ambulatory Visit: Payer: Self-pay | Admitting: Family Medicine

## 2018-09-13 MED ORDER — RIZATRIPTAN BENZOATE 10 MG PO TBDP: 10.0000 mg | ORAL_TABLET | ORAL | 6 refills | Status: DC | PRN

## 2018-09-13 NOTE — Telephone Encounter (Signed)
Patient states that she needs a refill on rizatriptan (MAXALT-MLT) 10 MG disintegrating tablet [[id number]]. Please advise once sent in.

## 2018-09-13 NOTE — Telephone Encounter (Signed)
Pt advised that medication was sent.Felicia KitchenMarland KitchenAudelia Hives James

## 2019-02-10 LAB — HM MAMMOGRAPHY

## 2019-03-21 ENCOUNTER — Ambulatory Visit: Payer: BC Managed Care – PPO | Admitting: Family Medicine

## 2019-03-28 ENCOUNTER — Ambulatory Visit (INDEPENDENT_AMBULATORY_CARE_PROVIDER_SITE_OTHER): Payer: BC Managed Care – PPO | Admitting: Family Medicine

## 2019-03-28 ENCOUNTER — Other Ambulatory Visit: Payer: Self-pay

## 2019-03-28 ENCOUNTER — Encounter: Payer: Self-pay | Admitting: Family Medicine

## 2019-03-28 VITALS — BP 160/88 | HR 60 | Ht 66.0 in | Wt 149.0 lb

## 2019-03-28 DIAGNOSIS — Z79899 Other long term (current) drug therapy: Secondary | ICD-10-CM

## 2019-03-28 DIAGNOSIS — R030 Elevated blood-pressure reading, without diagnosis of hypertension: Secondary | ICD-10-CM

## 2019-03-28 DIAGNOSIS — G43109 Migraine with aura, not intractable, without status migrainosus: Secondary | ICD-10-CM

## 2019-03-28 DIAGNOSIS — Z13220 Encounter for screening for lipoid disorders: Secondary | ICD-10-CM

## 2019-03-28 MED ORDER — PROPRANOLOL HCL 40 MG PO TABS: 40.0000 mg | ORAL_TABLET | Freq: Two times a day (BID) | ORAL | 3 refills | Status: DC

## 2019-03-28 MED ORDER — RIZATRIPTAN BENZOATE 10 MG PO TBDP: 10.0000 mg | ORAL_TABLET | ORAL | 6 refills | Status: DC | PRN

## 2019-03-28 NOTE — Assessment & Plan Note (Signed)
Continue current regimen.  Follow-up in 1 year.  At some point we could look at starting to wean/decrease the propranolol dose if she would like.  She can always just let me know.  We will get up-to-date lab work.

## 2019-03-28 NOTE — Progress Notes (Signed)
Established Patient Office Visit  Subjective:  Patient ID: Felicia James, female    DOB: Dec 07, 1975  Age: 44 y.o. MRN: [id number]  CC:  Chief Complaint  Patient presents with  . Migraine    HPI Felicia James presents for 1 year follow-up for migraine headaches.  Migraine headaches with aura-last saw her in February 2020.  At that time he was using Maxalt as needed and taking propranolol daily for prophylaxis. She has gained 8 pounds over the last year.  She says most of the time the Maxalt works well and only has a severe migraine every 3 or 4 months.  She feels like her current regimen is working really well for her and does not want to make any adjustments or changes today.  He has her GYN appointment tomorrow and will get her Pap smear done.  She did start Depo-Provera about 18 months ago and has done well on it so far.  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   Past Medical History:  Diagnosis Date  . Ankle swelling   . Edema   . Hypertension   . IUD 03-09-07  . Migraine   . Rheumatoid arthritis(714.0)     Past Surgical History:  Procedure Laterality Date  . MANDIBLE FRACTURE SURGERY     At age 46    Family History  Problem Relation Age of Onset  . Hypertension Father   . Heart attack Father 53  . Heart attack Other        grandfather  . Hyperlipidemia Mother   . Diabetes Maternal Grandmother   . Stroke Maternal Grandmother   . Diabetes Maternal Grandfather   . Heart disease Paternal Grandfather     Social History   Socioeconomic History  . Marital status: Married    Spouse name: Not on file  . Number of children: 1  . Years of education: Not on file  . Highest education level: Not on file  Occupational History  . Not on file  Tobacco Use  . Smoking status: Never Smoker  .  Smokeless tobacco: Never Used  Substance and Sexual Activity  . Alcohol use: Yes    Alcohol/week: 1.0 standard drinks    Types: 1 Standard drinks or equivalent per week    Comment: per week  . Drug use: No  . 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  Other Topics Concern  . Not on file  Social History Narrative  . Not on file   Social Determinants of Health   Financial Resource Strain:   . Difficulty of Paying Living Expenses: Not on file  Food Insecurity:   . Worried About Charity fundraiser in the Last Year: Not on file  . Ran Out of Food in the Last Year: Not on file  Transportation Needs:   . Lack of Transportation (Medical): Not on file  . Lack of Transportation (Non-Medical): Not on file  Physical Activity:   . Days of Exercise per Week: Not on file  . Minutes of Exercise per Session: Not on file  Stress:   . Feeling of Stress : Not on file  Social Connections:   .  Frequency of Communication with Friends and Family: Not on file  . Frequency of Social Gatherings with Friends and Family: Not on file  . Attends Religious Services: Not on file  . Active Member of Clubs or Organizations: Not on file  . Attends Banker Meetings: Not on file  . Marital Status: Not on file  Intimate Partner Violence:   . Fear of Current or Ex-Partner: Not on file  . Emotionally Abused: Not on file  . Physically Abused: Not on file  . Sexually Abused: Not on file    Outpatient Medications Prior to Visit  Medication Sig Dispense Refill  . medroxyPROGESTERone (DEPO-PROVERA) 150 MG/ML injection Inject 150 mg into the muscle every 3 (three) months.    . propranolol (INDERAL) 40 MG tablet Take 1 tablet (40 mg total) by mouth 2 (two) times daily. 180 tablet 3  . rizatriptan (MAXALT-MLT) 10 MG disintegrating tablet Take 1 tablet (10 mg total) by mouth as needed for migraine.  May repeat in 2 hours if needed 10 tablet 6   No facility-administered medications prior to visit.    No Known Allergies  ROS Review of Systems    Objective:    Physical Exam  Constitutional: She is oriented to person, place, and time. She appears well-developed and well-nourished.  HENT:  Head: Normocephalic and atraumatic.  Eyes: Conjunctivae are normal.  Cardiovascular: Normal rate, regular rhythm and normal heart sounds.  Pulmonary/Chest: Effort normal and breath sounds normal.  Neurological: She is alert and oriented to person, place, and time.  Skin: Skin is warm and dry.  Psychiatric: She has a normal mood and affect. Her behavior is normal.    BP (!) 160/88   Pulse 60   Ht 5\' 6"  (1.676 m)   Wt 149 lb (67.6 kg)   SpO2 99%   BMI 24.05 kg/m  Wt Readings from Last 3 Encounters:  03/28/19 149 lb (67.6 kg)  03/08/18 141 lb (64 kg)  01/22/18 130 lb (59 kg)     There are no preventive care reminders to display for this patient.  There are no preventive care reminders to display for this patient.  Lab Results  Component Value Date   TSH 1.586 02/08/2012   Lab Results  Component Value Date   WBC 9.4 07/01/2009   HGB 13.6 07/01/2009   HCT 41.2 07/01/2009   MCV 90.7 07/01/2009   PLT 336 07/01/2009   Lab Results  Component Value Date   NA 141 11/16/2017   K 4.2 11/16/2017   CO2 27 11/16/2017   GLUCOSE 97 11/16/2017   BUN 14 11/16/2017   CREATININE 0.68 11/16/2017   BILITOT 0.8 11/16/2017   ALKPHOS 58 02/08/2012   AST 13 11/16/2017   ALT 18 11/16/2017   PROT 6.8 11/16/2017   ALBUMIN 4.7 02/08/2012   CALCIUM 9.3 11/16/2017   Lab Results  Component Value Date   CHOL 150 11/16/2017   Lab Results  Component Value Date   HDL 49 (L) 11/16/2017   Lab Results  Component Value Date   LDLCALC 82 11/16/2017   Lab Results  Component Value Date   TRIG 96 11/16/2017   Lab Results  Component Value Date   CHOLHDL 3.1 11/16/2017   No results found  for: HGBA1C    Assessment & Plan:   Problem List Items Addressed This Visit      Cardiovascular and Mediastinum   Migraine with aura    Continue current regimen.  Follow-up in 1 year.  At some point we could look at starting to wean/decrease the propranolol dose if she would like.  She can always just let me know.  We will get up-to-date lab work.      Relevant Medications   rizatriptan (MAXALT-MLT) 10 MG disintegrating tablet   propranolol (INDERAL) 40 MG tablet   Other Relevant Orders   Lipid panel   COMPLETE METABOLIC PANEL WITH GFR   TSH    Other Visit Diagnoses    Screening, lipid    -  Primary   Relevant Orders   Lipid panel   COMPLETE METABOLIC PANEL WITH GFR   TSH   Medication management       Elevated BP without diagnosis of hypertension         She just had her mammogram in January so we will get that information abstracted.   Elevated blood pressure without diagnosis of hypertension-repeat blood pressure still elevated today.  She did take a Maxalt this morning and has been sort of battling a headache and had a very stressful day at work.  Normally her blood pressure looks phenomenal.  So we discussed checking it again tomorrow when she goes to see her OB/GYN.  If it still elevated she does have a home blood pressure cuff and she can check it over the next couple of weeks and then let us know what it is doing.  Meds ordered this encounter  Medications  . rizatriptan (MAXALT-MLT) 10 MG disintegrating tablet    Sig: Take 1 tablet (10 mg total) by mouth as needed for migraine. May repeat in 2 hours if needed    Dispense:  10 tablet    Refill:  6  . propranolol (INDERAL) 40 MG tablet    Sig: Take 1 tablet (40 mg total) by mouth 2 (two) times daily.    Dispense:  180 tablet    Refill:  3    Follow-up: Return in about 1 year (around 03/27/2020) for migraines .    Nani Gasser, MD

## 2019-03-29 LAB — RESULTS CONSOLE HPV: CHL HPV: NEGATIVE

## 2019-03-29 LAB — HM PAP SMEAR: HM Pap smear: NEGATIVE

## 2019-04-05 ENCOUNTER — Encounter: Payer: Self-pay | Admitting: Family Medicine

## 2019-08-04 ENCOUNTER — Encounter: Payer: Self-pay | Admitting: Family Medicine

## 2019-08-04 NOTE — Progress Notes (Signed)
Pap abstracted. 

## 2019-10-12 ENCOUNTER — Other Ambulatory Visit: Payer: Self-pay

## 2019-10-12 ENCOUNTER — Ambulatory Visit (INDEPENDENT_AMBULATORY_CARE_PROVIDER_SITE_OTHER): Payer: BC Managed Care – PPO | Admitting: Family Medicine

## 2019-10-12 DIAGNOSIS — G43109 Migraine with aura, not intractable, without status migrainosus: Secondary | ICD-10-CM | POA: Diagnosis not present

## 2019-10-12 MED ORDER — RIZATRIPTAN BENZOATE 10 MG PO TBDP: 10.0000 mg | ORAL_TABLET | ORAL | 3 refills | Status: DC | PRN

## 2019-10-12 NOTE — Progress Notes (Signed)
Established Patient Office Visit  Subjective:  Patient ID: Felicia James, female    DOB: 1975/07/25  Age: 44 y.o. MRN: [id number]  CC:  Chief Complaint  Patient presents with  . Migraine    HPI DIANNE WHELCHEL presents for migraine headaches-overall she feels like they are well controlled.  Some months she may only have 1 headache and other month she had may have multiple headaches days in a row.  But she is not typically using all 10 Maxalt per month.  She still on the propranolol.  No recent chest pain or shortness of breath.  Past Medical History:  Diagnosis Date  . Ankle swelling   . Edema   . Hypertension   . IUD 03-09-07  . Migraine   . Rheumatoid arthritis(714.0)     Past Surgical History:  Procedure Laterality Date  . MANDIBLE FRACTURE SURGERY     At age 53    Family History  Problem Relation Age of Onset  . Hypertension Father   . Heart attack Father 78  . Heart attack Other        grandfather  . Hyperlipidemia Mother   . Diabetes Maternal Grandmother   . Stroke Maternal Grandmother   . Diabetes Maternal Grandfather   . Heart disease Paternal Grandfather     Social History   Socioeconomic History  . Marital status: Married    Spouse name: Not on file  . Number of children: 1  . Years of education: Not on file  . Highest education level: Not on file  Occupational History  . Not on file  Tobacco Use  . Smoking status: Never Smoker  . Smokeless tobacco: Never Used  Substance and Sexual Activity  . Alcohol use: Yes    Alcohol/week: 1.0 standard drink    Types: 1 Standard drinks or equivalent per week    Comment: per week  . Drug use: No  . 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  Other Topics Concern  . Not on file  Social History Narrative  . Not on file   Social Determinants of Health   Financial Resource Strain:   . Difficulty  of Paying Living Expenses: Not on file  Food Insecurity:   . Worried About Programme researcher, broadcasting/film/video in the Last Year: Not on file  . Ran Out of Food in the Last Year: Not on file  Transportation Needs:   . Lack of Transportation (Medical): Not on file  . Lack of Transportation (Non-Medical): Not on file  Physical Activity:   . Days of Exercise per Week: Not on file  . Minutes of Exercise per Session: Not on file  Stress:   . Feeling of Stress : Not on file  Social Connections:   . Frequency of Communication with Friends and Family: Not on file  . Frequency of Social Gatherings with Friends and Family: Not on file  . Attends Religious Services: Not on file  . Active Member of Clubs or Organizations: Not on file  . Attends Banker Meetings: Not on file  . Marital Status: Not on file  Intimate Partner Violence:   . Fear of Current or Ex-Partner: Not on file  . Emotionally Abused: Not on file  . Physically Abused: Not on file  . Sexually Abused: Not on file    Outpatient Medications Prior to Visit  Medication Sig Dispense Refill  . medroxyPROGESTERone (DEPO-PROVERA) 150 MG/ML injection Inject 150 mg into the muscle every 3 (three) months.    . propranolol (INDERAL) 40 MG tablet Take 1 tablet (40 mg total) by mouth 2 (two) times daily. 180 tablet 3  . rizatriptan (MAXALT-MLT) 10 MG disintegrating tablet Take 1 tablet (10 mg total) by mouth as needed for migraine. May repeat in 2 hours if needed 10 tablet 6   No facility-administered medications prior to visit.    No Known Allergies  ROS Review of Systems    Objective:    Physical Exam Constitutional:      Appearance: She is well-developed.  HENT:     Head: Normocephalic and atraumatic.  Cardiovascular:     Rate and Rhythm: Normal rate and regular rhythm.     Heart sounds: Normal heart sounds.  Pulmonary:     Effort: Pulmonary effort is normal.     Breath sounds: Normal breath sounds.  Skin:    General: Skin  is warm and dry.  Neurological:     Mental Status: She is alert and oriented to person, place, and time.  Psychiatric:        Behavior: Behavior normal.     BP (!) 141/84   Pulse (!) 58   Ht 5\' 6"  (1.676 m)   Wt 140 lb (63.5 kg)   LMP 10/04/2019 (Exact Date)   SpO2 94%   BMI 22.60 kg/m  Wt Readings from Last 3 Encounters:  10/12/19 140 lb (63.5 kg)  03/28/19 149 lb (67.6 kg)  03/08/18 141 lb (64 kg)     Health Maintenance Due  Topic Date Due  . Hepatitis C Screening  Never done    There are no preventive care reminders to display for this patient.  Lab Results  Component Value Date   TSH 1.586 02/08/2012   Lab Results  Component Value Date   WBC 9.4 07/01/2009   HGB 13.6 07/01/2009   HCT 41.2 07/01/2009   MCV 90.7 07/01/2009   PLT 336 07/01/2009   Lab Results  Component Value Date   NA 141 11/16/2017   K 4.2 11/16/2017   CO2 27 11/16/2017   GLUCOSE 97 11/16/2017   BUN 14 11/16/2017   CREATININE 0.68 11/16/2017   BILITOT 0.8 11/16/2017   ALKPHOS 58 02/08/2012   AST 13 11/16/2017   ALT 18 11/16/2017   PROT 6.8 11/16/2017   ALBUMIN 4.7 02/08/2012   CALCIUM 9.3 11/16/2017   Lab Results  Component Value Date   CHOL 150 11/16/2017   Lab Results  Component Value Date   HDL 49 (L) 11/16/2017   Lab Results  Component Value Date   LDLCALC 82 11/16/2017   Lab Results  Component Value Date   TRIG 96 11/16/2017   Lab Results  Component Value Date   CHOLHDL 3.1 11/16/2017   No results found for: HGBA1C    Assessment & Plan:   Problem List Items Addressed This Visit      Cardiovascular and Mediastinum   Migraine with aura    Continue current regiment we did discuss that there are some newer migraine medications on the market if at any point she feels like her headaches are ramping up and she is not responding as well to the propranolol she is been on for quite some time.  She will be due for refill in March.  Otherwise follow-up in 1 year or  sooner if needed.  We will change Maxalt to  90-day supply.      Relevant Medications   rizatriptan (MAXALT-MLT) 10 MG disintegrating tablet      Meds ordered this encounter  Medications  . rizatriptan (MAXALT-MLT) 10 MG disintegrating tablet    Sig: Take 1 tablet (10 mg total) by mouth as needed for migraine. May repeat in 2 hours if needed. 90 days    Dispense:  30 tablet    Refill:  3    Follow-up: Return in about 1 year (around 10/11/2020), or if symptoms worsen or fail to improve, for Migraines.    Nani Gasser, MD

## 2019-10-12 NOTE — Assessment & Plan Note (Signed)
Continue current regiment we did discuss that there are some newer migraine medications on the market if at any point she feels like her headaches are ramping up and she is not responding as well to the propranolol she is been on for quite some time.  She will be due for refill in March.  Otherwise follow-up in 1 year or sooner if needed.  We will change Maxalt to 90-day supply.

## 2020-02-12 LAB — HM MAMMOGRAPHY

## 2020-03-28 ENCOUNTER — Encounter: Payer: Self-pay | Admitting: Family Medicine

## 2020-04-21 ENCOUNTER — Other Ambulatory Visit: Payer: Self-pay | Admitting: Family Medicine

## 2020-04-21 DIAGNOSIS — G43109 Migraine with aura, not intractable, without status migrainosus: Secondary | ICD-10-CM

## 2020-07-15 ENCOUNTER — Telehealth: Payer: Self-pay | Admitting: *Deleted

## 2020-07-15 NOTE — Telephone Encounter (Signed)
Pt had called and lvm asking for a return call. She did not state the purpose of her call.   I called back and lvm asking that she call me back.

## 2020-10-06 ENCOUNTER — Other Ambulatory Visit: Payer: Self-pay | Admitting: Family Medicine

## 2021-05-26 ENCOUNTER — Other Ambulatory Visit: Payer: Self-pay | Admitting: Family Medicine

## 2021-05-26 DIAGNOSIS — G43109 Migraine with aura, not intractable, without status migrainosus: Secondary | ICD-10-CM

## 2021-08-20 ENCOUNTER — Other Ambulatory Visit: Payer: Self-pay | Admitting: Family Medicine

## 2021-08-20 DIAGNOSIS — G43109 Migraine with aura, not intractable, without status migrainosus: Secondary | ICD-10-CM

## 2021-08-20 NOTE — Telephone Encounter (Signed)
Please contact pt to schedule appt with Dr. Linford Arnold. Pt not seen since 2021. Unable to provide refills. Thanks

## 2021-08-20 NOTE — Telephone Encounter (Signed)
LVM for patient to call back to get appt with PCP scheduled for further med refills. AMUCK

## 2021-08-25 ENCOUNTER — Other Ambulatory Visit: Payer: Self-pay | Admitting: Family Medicine

## 2021-08-25 DIAGNOSIS — G43109 Migraine with aura, not intractable, without status migrainosus: Secondary | ICD-10-CM

## 2021-08-25 MED ORDER — PROPRANOLOL HCL 40 MG PO TABS: 40.0000 mg | ORAL_TABLET | Freq: Two times a day (BID) | ORAL | 0 refills | Status: DC

## 2021-09-27 ENCOUNTER — Other Ambulatory Visit: Payer: Self-pay | Admitting: Family Medicine

## 2021-09-29 ENCOUNTER — Encounter: Payer: Self-pay | Admitting: Family Medicine

## 2021-09-29 ENCOUNTER — Ambulatory Visit: Payer: BC Managed Care – PPO | Admitting: Family Medicine

## 2021-09-29 VITALS — BP 139/88 | HR 76 | Wt 144.0 lb

## 2021-09-29 DIAGNOSIS — Z130 Encounter for screening for diseases of the blood and blood-forming organs and certain disorders involving the immune mechanism: Secondary | ICD-10-CM

## 2021-09-29 DIAGNOSIS — Z13220 Encounter for screening for lipoid disorders: Secondary | ICD-10-CM

## 2021-09-29 DIAGNOSIS — G43109 Migraine with aura, not intractable, without status migrainosus: Secondary | ICD-10-CM

## 2021-09-29 DIAGNOSIS — Z8041 Family history of malignant neoplasm of ovary: Secondary | ICD-10-CM

## 2021-09-29 MED ORDER — PROPRANOLOL HCL 40 MG PO TABS: 40.0000 mg | ORAL_TABLET | Freq: Two times a day (BID) | ORAL | 2 refills | Status: DC

## 2021-09-29 MED ORDER — RIZATRIPTAN BENZOATE 10 MG PO TBDP: ORAL_TABLET | ORAL | 3 refills | Status: DC

## 2021-09-29 NOTE — Progress Notes (Signed)
   Established Patient Office Visit  Subjective   Patient ID: Felicia James, female    DOB: Aug 11, 1975  Age: 46 y.o. MRN: [id number]  Chief Complaint  Patient presents with   Medication Refill    HPI  Follow-up migraine headaches-she feels like overall she is doing well she says the propranolol is effective the Maxalt still works when she uses it but she does need refills.  She does not want to make any adjustments or changes today.  She still has triggers with heat and weather changes.  Chest pain or shortness of breath.  Her mother who was recently diagnosed with metastatic ovarian cancer is in the hospital and not doing well she has unfortunately not been doing well with the chemotherapy has made her quite sick.  She also asks about genetic testing and whether or not she would be a candidate for that.    ROS    Objective:     BP 139/88   Pulse 76   Wt 144 lb (65.3 kg)   SpO2 98%   BMI 23.24 kg/m    Physical Exam Vitals and nursing note reviewed.  Constitutional:      Appearance: She is well-developed.  HENT:     Head: Normocephalic and atraumatic.  Cardiovascular:     Rate and Rhythm: Normal rate and regular rhythm.     Heart sounds: Normal heart sounds.  Pulmonary:     Effort: Pulmonary effort is normal.     Breath sounds: Normal breath sounds.  Skin:    General: Skin is warm and dry.  Neurological:     Mental Status: She is alert and oriented to person, place, and time.  Psychiatric:        Behavior: Behavior normal.      No results found for any visits on 09/29/21.    The ASCVD Risk score (Arnett DK, et al., 2019) failed to calculate for the following reasons:   Cannot find a previous HDL lab   Cannot find a previous total cholesterol lab    Assessment & Plan:   Problem List Items Addressed This Visit       Cardiovascular and Mediastinum   Migraine with aura    Continue propranolol daily.  Continue Maxalt as needed.  We will send refills  to the pharmacy.  I really would like to get some updated lab work done.      Relevant Medications   rizatriptan (MAXALT-MLT) 10 MG disintegrating tablet   propranolol (INDERAL) 40 MG tablet   Other Relevant Orders   COMPLETE METABOLIC PANEL WITH GFR   Other Visit Diagnoses     Screening, lipid    -  Primary   Relevant Orders   Lipid Panel w/reflex Direct LDL   COMPLETE METABOLIC PANEL WITH GFR   Screening, anemia, deficiency, iron       Relevant Orders   CBC   Family history of ovarian cancer          Did discuss getting some up-to-date lab work as I do not have any recent labs on file for her.  She did have her mammogram done earlier this year.   Return in about 1 year (around 09/30/2022).    Nani Gasser, MD

## 2021-09-29 NOTE — Assessment & Plan Note (Signed)
Continue propranolol daily.  Continue Maxalt as needed.  We will send refills to the pharmacy.  I really would like to get some updated lab work done.

## 2021-10-15 ENCOUNTER — Telehealth: Payer: Self-pay

## 2021-10-15 NOTE — Telephone Encounter (Signed)
Patient dropped paperwork off at the front desk for her FMLA paperwork to be filled out by Oct. 3rd. She stated that she tried faxing the paperwork over several times and kept getting the busy signal. Paperwork was placed In the basket. Patient would like a call once the paperwork is completed and a notification in [place]. tvt

## 2021-10-17 NOTE — Telephone Encounter (Signed)
Paperwork completed will place in [place] [employer] on Monday to fax.

## 2021-10-21 NOTE — Telephone Encounter (Signed)
Pt came by the office this morning and Felicia James her her FMLA forms.

## 2022-07-15 LAB — HM MAMMOGRAPHY

## 2022-08-09 ENCOUNTER — Other Ambulatory Visit: Payer: Self-pay | Admitting: Family Medicine

## 2022-08-09 DIAGNOSIS — G43109 Migraine with aura, not intractable, without status migrainosus: Secondary | ICD-10-CM

## 2022-10-10 ENCOUNTER — Other Ambulatory Visit: Payer: Self-pay | Admitting: Family Medicine

## 2022-10-10 DIAGNOSIS — G43109 Migraine with aura, not intractable, without status migrainosus: Secondary | ICD-10-CM

## 2022-10-13 NOTE — Telephone Encounter (Signed)
Pt is due for OV for refill on propranolol 40 mg.

## 2022-10-14 ENCOUNTER — Other Ambulatory Visit: Payer: Self-pay | Admitting: Family Medicine

## 2022-10-14 DIAGNOSIS — G43109 Migraine with aura, not intractable, without status migrainosus: Secondary | ICD-10-CM

## 2022-10-14 NOTE — Telephone Encounter (Signed)
Patient is requesting medication until appointment on 10/29

## 2022-10-14 NOTE — Telephone Encounter (Signed)
Called patient, Felicia James

## 2022-10-15 NOTE — Telephone Encounter (Signed)
Requesting rx rf of propanolol 40mg   Last written 08/10/2022 Last OV 09/29/2021 Upcoming appt 11/17/2022

## 2022-11-17 ENCOUNTER — Telehealth (INDEPENDENT_AMBULATORY_CARE_PROVIDER_SITE_OTHER): Payer: BC Managed Care – PPO | Admitting: Family Medicine

## 2022-11-17 DIAGNOSIS — Z1211 Encounter for screening for malignant neoplasm of colon: Secondary | ICD-10-CM

## 2022-11-17 DIAGNOSIS — G43109 Migraine with aura, not intractable, without status migrainosus: Secondary | ICD-10-CM

## 2022-11-17 DIAGNOSIS — Z5181 Encounter for therapeutic drug level monitoring: Secondary | ICD-10-CM

## 2022-11-17 DIAGNOSIS — Z13220 Encounter for screening for lipoid disorders: Secondary | ICD-10-CM

## 2022-11-17 MED ORDER — RIZATRIPTAN BENZOATE 10 MG PO TBDP
ORAL_TABLET | ORAL | 3 refills | Status: DC
Start: 2022-11-17 — End: 2023-10-25

## 2022-11-17 MED ORDER — PROPRANOLOL HCL 40 MG PO TABS
40.0000 mg | ORAL_TABLET | Freq: Two times a day (BID) | ORAL | 3 refills | Status: DC
Start: 2022-11-17 — End: 2023-10-25

## 2022-11-17 NOTE — Assessment & Plan Note (Signed)
We discussed options for her migraines right now she feels like she is doing okay with her propranolol so we will continue with current regimen she says the Maxalt actually works great for her.  Will make sure she is got refills sent to the pharmacy.  Continue to work on avoiding triggers.  Weather is a big 1 for her.

## 2022-11-17 NOTE — Progress Notes (Signed)
Established Patient Office Visit  Subjective   Patient ID: Felicia James, female    DOB: 1975/12/01  Age: 47 y.o. MRN: [id number]  Chief Complaint  Patient presents with   Medication Refill    Pt has had mammo,and pap and would like to get colonoscopy scheduled for the summer    HPI  1 yr f/u:    F/U Migraine HA - she is getting about 1 HA a.  She is usually able to take the Maxalt as soon as she starts to get those warning signs and symptoms and get it to abort.  She does notice that it makes her feel little sluggish brain.  She is taking propranolol daily and otherwise tolerating it well.  She is interested in getting her colonoscopy done locally.  [facility] with [facility] her [facility] smear and gets her mammogram done at [facility].    ROS    Objective:     There were no vitals taken for this visit.   Physical Exam Vitals reviewed.  Constitutional:      Appearance: Normal appearance.  HENT:     Head: Normocephalic.  Pulmonary:     Effort: Pulmonary effort is normal.  Neurological:     Mental Status: She is alert and oriented to person, place, and time.  Psychiatric:        Mood and Affect: Mood normal.        Behavior: Behavior normal.      Results for orders placed or performed in visit on 11/17/22  HM MAMMOGRAPHY  Result Value Ref Range   HM Mammogram 0-4 Bi-Rad 0-4 Bi-Rad, Self Reported Normal  Results Console HPV  Result Value Ref Range   CHL HPV Negative       The ASCVD Risk score (Arnett DK, et al., 2019) failed to calculate for the following reasons:   Cannot find a previous HDL lab   Cannot find a previous total cholesterol lab    Assessment & Plan:   Problem List Items Addressed This Visit       Cardiovascular and Mediastinum   Migraine with aura - Primary    We discussed options for her migraines right now she feels like she is doing okay with her propranolol so we will continue with current regimen she says the Maxalt  actually works great for her.  Will make sure she is got refills sent to the pharmacy.  Continue to work on avoiding triggers.  Weather is a big 1 for her.      Relevant Medications   rizatriptan (MAXALT-MLT) 10 MG disintegrating tablet   propranolol (INDERAL) 40 MG tablet   Other Relevant Orders   CMP14+EGFR   Lipid panel   CBC   Other Visit Diagnoses     Screening, lipid       Relevant Orders   CMP14+EGFR   Lipid panel   CBC   Medication monitoring encounter       Relevant Orders   CMP14+EGFR   Lipid panel   CBC   Screen for colon cancer       Relevant Orders   Ambulatory referral to Gastroenterology      I discussed getting some up-to-date labs and getting at least a lipid screen on her since she is now over 44.  She will try to come by in the next month or 2 to get the lab work done if she has a day off.  Get colonoscopy screenings ordered.  No follow-ups  on file.    Nani Gasser, MD

## 2023-01-27 ENCOUNTER — Other Ambulatory Visit: Payer: Self-pay

## 2023-01-27 ENCOUNTER — Ambulatory Visit
Admission: EM | Admit: 2023-01-27 | Discharge: 2023-01-27 | Disposition: A | Discharge status: Home / Self Care | Payer: 59

## 2023-01-27 DIAGNOSIS — R0789 Other chest pain: Secondary | ICD-10-CM

## 2023-01-27 DIAGNOSIS — K219 Gastro-esophageal reflux disease without esophagitis: Secondary | ICD-10-CM

## 2023-01-27 NOTE — Discharge Instructions (Signed)
 Read information about acid reflux disease Take Prilosec once a day for a couple of weeks After this take it as needed If you continue to need regular Prilosec or have worsening symptoms make an appointment with your PCP

## 2023-01-27 NOTE — ED Triage Notes (Signed)
 Since yesterday has had intermittent chest pain, feels like indigestion. Has had it again the last few hours. No n/v, no diaphoresis. Has taken prilosec which did help.

## 2023-01-27 NOTE — ED Provider Notes (Signed)
 Felicia James    CSN: [id number] Arrival date & time: 01/27/23  1718      History   Chief Complaint No chief complaint on file.   HPI Felicia James is a 48 y.o. female.   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  He is here for evaluation of chest chest.  It was a sharp pain.  She took an antacid and it went away.  The night before she had had chicken tacos, but she had made them at home and they were not spicy.  No alcohol.  No NSAIDs.  No new medicines or supplements.  She is having some stress at home and work.  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  She woke up this morning with similar pain.  She took one of her husbands Prilosec, the pain went away and has not been there all day.  She is here after work to be evaluated, concern for chest pain She has no heart disease.  No risk factors for heart disease such as hypertension, hyperlipidemia, diabetes, family history.  She is on Depo shots for birth control.    Past Medical History:  Diagnosis Date   Ankle swelling    Edema    Hypertension    IUD 03-09-07   Migraine    Rheumatoid arthritis(714.0)     Patient Active Problem List   Diagnosis Date Noted   Single live birth 07/27/2014   Migraine with aura 09/11/2013   Enlarged lymph nodes 07/01/2009   POLYARTHRITIS 01/08/2009   KNEE PAIN 07/05/2008    Past Surgical History:  Procedure Laterality Date   MANDIBLE FRACTURE SURGERY     At age 39    OB History   No obstetric history on file.      Home Medications    Prior to Admission medications   Medication Sig Start Date End Date Taking? Authorizing Provider  medroxyPROGESTERone (DEPO-PROVERA) 150 MG/ML injection Inject 150 mg into the muscle every 3 (three) months.    [provider]  propranolol  (INDERAL ) 40 MG tablet Take 1 tablet (40 mg total) by mouth 2 (two) times daily. 11/17/22   Alvan Dorothyann BIRCH, MD  rizatriptan  (MAXALT -MLT) 10 MG disintegrating tablet  TAKE 1 TABLET BY MOUTH AS NEEDED FOR MIGRAINE. MAY REPEAT IN 2 HOURS IF NEEDED. 90 DAYS 11/17/22   Alvan Dorothyann BIRCH, MD    Family History Family History  Problem Relation Age of Onset   Hypertension Father    Heart attack Father 50   Heart attack Other        grandfather   Hyperlipidemia Mother    Diabetes Maternal Grandmother    Stroke Maternal Grandmother    Diabetes Maternal Grandfather    Heart disease Paternal Grandfather     Social History Social History   Tobacco Use   Smoking status: Never   Smokeless tobacco: Never  Substance Use Topics   Alcohol use: Yes    Alcohol/week: 1.0 standard drink of alcohol    Types: 1 Standard drinks or equivalent per week    Comment: per week   Drug use: No     Allergies   Patient has no known allergies.   Review of Systems Review of Systems See HPI  Physical Exam Triage Vital Signs ED Triage Vitals  Encounter Vitals Group     BP 01/27/23 1720 (!) 177/98     Systolic BP Percentile -- Blood pressure at prior visits always normal  Diastolic BP Percentile --      Pulse Rate 01/27/23 1720 72     Resp 01/27/23 1720 16     Temp 01/27/23 1720 98 F (36.7 C)     Temp Source 01/27/23 1720 Oral     SpO2 01/27/23 1720 100 %     Weight --      Height --      Head Circumference --      Peak Flow --      Pain Score 01/27/23 1728 6     Pain Loc --      Pain Education --      Exclude from Growth Chart --    No data found.  Updated Vital Signs BP (!) 177/98   Pulse 72   Temp 98 F (36.7 C) (Oral)   Resp 16   SpO2 100%       Physical Exam Constitutional:      General: She is not in acute distress.    Appearance: She is well-developed and normal weight.  HENT:     Head: Normocephalic and atraumatic.     Right Ear: Tympanic membrane normal.     Left Ear: Tympanic membrane normal.     Nose: Nose normal. No congestion.     Mouth/Throat:     Mouth: Mucous membranes are moist.     Pharynx: No posterior  oropharyngeal erythema.  Eyes:     Conjunctiva/sclera: Conjunctivae normal.     Pupils: Pupils are equal, round, and reactive to light.  Cardiovascular:     Rate and Rhythm: Normal rate and regular rhythm.     Heart sounds: Normal heart sounds.  Pulmonary:     Effort: Pulmonary effort is normal. No respiratory distress.     Breath sounds: No wheezing or rhonchi.  Chest:     Chest wall: No tenderness.  Abdominal:     General: There is no distension.     Palpations: Abdomen is soft.     Tenderness: There is no abdominal tenderness.  Musculoskeletal:        General: Normal range of motion.     Cervical back: Normal range of motion.  Skin:    General: Skin is warm and dry.  Neurological:     Mental Status: She is alert.      UC Treatments / Results  Labs (all labs ordered are listed, but only abnormal results are displayed) Labs Reviewed - No data to display  EKG EKG is normal.  Possible borderline LVH, which I think is normal for patient.  No ST or T wave changes  Radiology No results found.  Procedures Procedures (including critical James time)  Medications Ordered in UC Medications - No data to display  Initial Impression / Assessment and Plan / UC Course  I have reviewed the triage vital signs and the nursing notes.  Pertinent labs & imaging results that were available during my James of the patient were reviewed by me and considered in my medical decision making (see chart for details).     Discussed cardiac chest pain.  Discussed noncardiac chest pain.  I believe patient is having some acid reflux symptoms. Final Clinical Impressions(s) / UC Diagnoses   Final diagnoses:  Non-cardiac chest pain  Gastroesophageal reflux disease, unspecified whether esophagitis present     Discharge Instructions      Read information about acid reflux disease Take Prilosec once a day for a couple of weeks After this take it as needed If  you continue to need regular  Prilosec or have worsening symptoms make an appointment with your PCP   ED Prescriptions   None    PDMP not reviewed this encounter.   Maranda Jamee Jacob, MD 01/27/23 [phone]

## 2023-03-29 LAB — HM COLONOSCOPY

## 2023-10-25 ENCOUNTER — Other Ambulatory Visit: Payer: Self-pay | Admitting: Family Medicine

## 2023-10-25 DIAGNOSIS — G43109 Migraine with aura, not intractable, without status migrainosus: Secondary | ICD-10-CM

## 2023-10-25 NOTE — Telephone Encounter (Unsigned)
 Copied from CRM #[id number]. Topic: Clinical - Medication Refill >> Oct 25, 2023 10:04 AM Rosaria A wrote: Medication: propranolol  (INDERAL ) 40 MG tablet rizatriptan  (MAXALT -MLT) 10 MG disintegrating tablet  Has the patient contacted their pharmacy? No   This is the patient's preferred pharmacy:  [phone] - [place], [place] - [address] [place] [address] Phone: [phone] Fax: [phone]  Is this the correct pharmacy for this prescription? Yes   Has the prescription been filled recently? No  Is the patient out of the medication? No  Has the patient been seen for an appointment in the last year OR does the patient have an upcoming appointment? Yes  Can we respond through MyChart? Yes  Agent: Please be advised that Rx refills may take up to 3 business days. We ask that you follow-up with your pharmacy.

## 2023-10-26 MED ORDER — PROPRANOLOL HCL 40 MG PO TABS: 40.0000 mg | ORAL_TABLET | Freq: Two times a day (BID) | ORAL | 0 refills | Status: DC

## 2023-10-26 MED ORDER — RIZATRIPTAN BENZOATE 10 MG PO TBDP: ORAL_TABLET | ORAL | 1 refills | Status: DC

## 2023-12-28 ENCOUNTER — Ambulatory Visit: Admitting: Family Medicine

## 2023-12-28 ENCOUNTER — Encounter: Payer: Self-pay | Admitting: Family Medicine

## 2023-12-28 VITALS — BP 122/68 | HR 65 | Ht 66.0 in | Wt 142.0 lb

## 2023-12-28 DIAGNOSIS — Z1159 Encounter for screening for other viral diseases: Secondary | ICD-10-CM

## 2023-12-28 DIAGNOSIS — G43109 Migraine with aura, not intractable, without status migrainosus: Secondary | ICD-10-CM

## 2023-12-28 DIAGNOSIS — Z13220 Encounter for screening for lipoid disorders: Secondary | ICD-10-CM

## 2023-12-28 MED ORDER — RIZATRIPTAN BENZOATE 10 MG PO TBDP: ORAL_TABLET | ORAL | 1 refills | Status: AC

## 2023-12-28 MED ORDER — PROPRANOLOL HCL 40 MG PO TABS: 40.0000 mg | ORAL_TABLET | Freq: Two times a day (BID) | ORAL | 3 refills | Status: AC

## 2023-12-28 NOTE — Progress Notes (Signed)
 Established Patient Office Visit  Patient ID: Felicia James, female    DOB: 19-May-1975  Age: 48 y.o. MRN: [id number] PCP: Alvan Felicia BIRCH, MD  Chief Complaint  Patient presents with   Migraine         Subjective:     HPI  Discussed the use of AI scribe software for clinical note transcription with the patient, who gave verbal consent to proceed.  History of Present Illness MARLISE James is a 48 year old female with migraines who presents for follow-up on her migraine management.  Migraine headaches - Three to four migraine headaches in the past thirty days - Prodromal symptoms include eye pain or a sensation similar to sinus pain, which she recognizes as a migraine precursor - Severe migraines, requiring her to lay down and associated with nausea, occur approximately once every three to four months - Typically able to intervene before migraines escalate to severe intensity  Migraine management and medication effects - Uses Maxalt  (rizatriptan ) for acute migraine management - Maxalt  is effective in aborting migraine symptoms most of the tme - Occasional side effect of grogginess after taking Maxalt , as experienced recently when she felt groggy all day without developing a headache  Prophylactic therapy tolerance - Currently taking propranolol  for migraine prophylaxis - No concerns or adverse effects with propranolol   Barriers to care - Early morning school schedule makes it difficult to obtain blood work      ROS    Objective:     BP 122/68   Pulse 65   Ht 5' 6 (1.676 m)   Wt 142 lb (64.4 kg)   SpO2 99%   BMI 22.92 kg/m     Physical Exam Vitals and nursing note reviewed.  Constitutional:      Appearance: Normal appearance.  HENT:     Head: Normocephalic and atraumatic.  Eyes:     Conjunctiva/sclera: Conjunctivae normal.  Cardiovascular:     Rate and Rhythm: Normal rate and regular rhythm.  Pulmonary:     Effort: Pulmonary effort is  normal.     Breath sounds: Normal breath sounds.  Skin:    General: Skin is warm and dry.  Neurological:     Mental Status: She is alert.  Psychiatric:        Mood and Affect: Mood normal.      No results found for any visits on 12/28/23.     The ASCVD Risk score (Arnett DK, et al., 2019) failed to calculate for the following reasons:   Cannot find a previous HDL lab   Cannot find a previous total cholesterol lab    Assessment & Plan:   Problem List Items Addressed This Visit       Cardiovascular and Mediastinum   Migraine with aura   Relevant Medications   propranolol  (INDERAL ) 40 MG tablet   rizatriptan  (MAXALT -MLT) 10 MG disintegrating tablet   Other Relevant Orders   Lipid Panel With LDL/HDL Ratio   CMP14+EGFR   CBC   Other Visit Diagnoses       Screening, lipid    -  Primary   Relevant Orders   Lipid Panel With LDL/HDL Ratio   CMP14+EGFR   CBC     Need for hepatitis C screening test       Relevant Orders   Hepatitis C Antibody       Assessment and Plan Assessment & Plan Migraine with aura Experiences migraines with aura 3-4 times monthly. Triptan effective , occasionally causes  grogginess rarely. Severe migraines occur every 3-4 months, causing nausea. Continues propranolol  without issues. - Continue current triptan regimen. - Consider reducing triptan dose to 5 mg if grogginess increases. - Consider triptan with Aleve or ibuprofen for severe migraines to enhance efficacy.  General Health Maintenance Due for routine blood work. Mammogram completed. Colonoscopy up to date. Cervical cancer screening scheduled for 2026. - Ordered blood work: kidney, liver function tests, cholesterol, hemoglobin. - Reminded of cervical cancer screening due in 2026.   Return in about 1 year (around 12/27/2024) for Migraines .    Felicia Byars, MD [facility] at [facility]

## 2023-12-29 ENCOUNTER — Ambulatory Visit: Payer: Self-pay | Admitting: Family Medicine

## 2023-12-29 LAB — CMP14+EGFR
ALT: 14 IU/L (ref 0–32)
AST: 17 IU/L (ref 0–40)
Albumin: 5.2 g/dL — ABNORMAL HIGH (ref 3.9–4.9)
Alkaline Phosphatase: 75 IU/L (ref 41–116)
BUN/Creatinine Ratio: 15 (ref 9–23)
BUN: 12 mg/dL (ref 6–24)
Bilirubin Total: 0.5 mg/dL (ref 0.0–1.2)
CO2: 24 mmol/L (ref 20–29)
Calcium: 10.1 mg/dL (ref 8.7–10.2)
Chloride: 102 mmol/L (ref 96–106)
Creatinine, Ser: 0.78 mg/dL (ref 0.57–1.00)
Globulin, Total: 2.1 g/dL (ref 1.5–4.5)
Glucose: 109 mg/dL — ABNORMAL HIGH (ref 70–99)
Potassium: 4 mmol/L (ref 3.5–5.2)
Sodium: 141 mmol/L (ref 134–144)
Total Protein: 7.3 g/dL (ref 6.0–8.5)
eGFR: 94 mL/min/1.73 (ref >59)

## 2023-12-29 LAB — HEPATITIS C ANTIBODY: Hep C Virus Ab: NONREACTIVE

## 2023-12-29 LAB — CBC
Hematocrit: 44.4 % (ref 34.0–46.6)
Hemoglobin: 14.7 g/dL (ref 11.1–15.9)
MCH: 29.7 pg (ref 26.6–33.0)
MCHC: 33.1 g/dL (ref 31.5–35.7)
MCV: 90 fL (ref 79–97)
Platelets: 358 x10E3/uL (ref 150–450)
RBC: 4.95 x10E6/uL (ref 3.77–5.28)
RDW: 11.9 % (ref 11.7–15.4)
WBC: 9.7 x10E3/uL (ref 3.4–10.8)

## 2023-12-29 LAB — LIPID PANEL WITH LDL/HDL RATIO
Cholesterol, Total: 163 mg/dL (ref 100–199)
HDL: 54 mg/dL (ref >39)
LDL Chol Calc (NIH): 87 mg/dL (ref 0–99)
LDL/HDL Ratio: 1.6 ratio (ref 0.0–3.2)
Triglycerides: 121 mg/dL (ref 0–149)
VLDL Cholesterol Cal: 22 mg/dL (ref 5–40)

## 2023-12-29 NOTE — Progress Notes (Signed)
 HI Felicia James,  Your metabolic panel overall looks good.  Cholesterol looks good as well.  Blood counts normal.  Negative for hepatitis C.  Also wanted to wish you a happy early birthday!
